# Patient Record
Sex: Male | Born: 1975 | Race: Black or African American | Hispanic: No | Marital: Married | Smoking: Never smoker
Health system: Southern US, Community
[De-identification: ages and names within clinical notes are randomized; demographics above are authoritative.]

## PROBLEM LIST (undated history)

## (undated) DIAGNOSIS — S82143A Displaced bicondylar fracture of unspecified tibia, initial encounter for closed fracture: Secondary | ICD-10-CM

## (undated) DIAGNOSIS — I10 Essential (primary) hypertension: Secondary | ICD-10-CM

## (undated) DIAGNOSIS — E119 Type 2 diabetes mellitus without complications: Secondary | ICD-10-CM

## (undated) DIAGNOSIS — E785 Hyperlipidemia, unspecified: Secondary | ICD-10-CM

## (undated) DIAGNOSIS — S7291XA Unspecified fracture of right femur, initial encounter for closed fracture: Secondary | ICD-10-CM

## (undated) HISTORY — DX: Essential (primary) hypertension: I10

## (undated) HISTORY — DX: Hyperlipidemia, unspecified: E78.5

## (undated) HISTORY — DX: Type 2 diabetes mellitus without complications: E11.9

---

## 2003-11-06 ENCOUNTER — Emergency Department (HOSPITAL_COMMUNITY): Admission: AD | Admit: 2003-11-06 | Discharge: 2003-11-06 | Payer: Self-pay | Admitting: Family Medicine

## 2010-01-28 ENCOUNTER — Emergency Department (HOSPITAL_COMMUNITY): Admission: EM | Admit: 2010-01-28 | Discharge: 2010-01-28 | Payer: Self-pay | Admitting: Family Medicine

## 2010-01-29 ENCOUNTER — Emergency Department (HOSPITAL_COMMUNITY): Admission: EM | Admit: 2010-01-29 | Discharge: 2010-01-29 | Payer: Self-pay | Admitting: Emergency Medicine

## 2010-12-23 LAB — CBC
HCT: 44.2 % (ref 39.0–52.0)
Platelets: 195 10*3/uL (ref 150–400)

## 2010-12-23 LAB — BASIC METABOLIC PANEL
BUN: 7 mg/dL (ref 6–23)
Chloride: 103 mEq/L (ref 96–112)
Creatinine, Ser: 0.94 mg/dL (ref 0.4–1.5)
GFR calc non Af Amer: >60 mL/min (ref >60)
Glucose, Bld: 132 mg/dL — ABNORMAL HIGH (ref 70–99)
Potassium: 4.2 mEq/L (ref 3.5–5.1)
Sodium: 137 mEq/L (ref 135–145)

## 2010-12-23 LAB — DIFFERENTIAL
Basophils Relative: 0 % (ref 0–1)
Eosinophils Relative: 0 % (ref 0–5)
Monocytes Absolute: 0.9 10*3/uL (ref 0.1–1.0)
Monocytes Relative: 8 % (ref 3–12)

## 2010-12-23 LAB — POCT INFECTIOUS MONO SCREEN: Mono Screen: POSITIVE — AB

## 2010-12-23 LAB — POCT RAPID STREP A (OFFICE): Streptococcus, Group A Screen (Direct): NEGATIVE

## 2014-10-23 ENCOUNTER — Emergency Department (HOSPITAL_COMMUNITY)
Admission: EM | Admit: 2014-10-23 | Discharge: 2014-10-23 | Disposition: A | Discharge status: Home / Self Care | Payer: BC Managed Care – PPO | Attending: Emergency Medicine | Admitting: Emergency Medicine

## 2014-10-23 ENCOUNTER — Encounter (HOSPITAL_COMMUNITY): Payer: Self-pay

## 2014-10-23 DIAGNOSIS — R002 Palpitations: Secondary | ICD-10-CM

## 2014-10-23 DIAGNOSIS — Z791 Long term (current) use of non-steroidal anti-inflammatories (NSAID): Secondary | ICD-10-CM | POA: Diagnosis not present

## 2014-10-23 LAB — I-STAT CHEM 8, ED
BUN: 10 mg/dL (ref 6–23)
CREATININE: 1 mg/dL (ref 0.50–1.35)
Calcium, Ion: 1.17 mmol/L (ref 1.12–1.23)
Chloride: 102 mEq/L (ref 96–112)
Glucose, Bld: 125 mg/dL — ABNORMAL HIGH (ref 70–99)
HEMATOCRIT: 49 % (ref 39.0–52.0)
Hemoglobin: 16.7 g/dL (ref 13.0–17.0)
Potassium: 3.8 mmol/L (ref 3.5–5.1)
SODIUM: 142 mmol/L (ref 135–145)
TCO2: 25 mmol/L (ref 0–100)

## 2014-10-23 LAB — RAPID URINE DRUG SCREEN, HOSP PERFORMED
AMPHETAMINES: NOT DETECTED
BARBITURATES: NOT DETECTED
BENZODIAZEPINES: NOT DETECTED
Cocaine: NOT DETECTED
OPIATES: NOT DETECTED
Tetrahydrocannabinol: NOT DETECTED

## 2014-10-23 LAB — URINALYSIS, ROUTINE W REFLEX MICROSCOPIC
BILIRUBIN URINE: NEGATIVE
Glucose, UA: NEGATIVE mg/dL
Hgb urine dipstick: NEGATIVE
KETONES UR: NEGATIVE mg/dL
LEUKOCYTES UA: NEGATIVE
NITRITE: NEGATIVE
PH: 7 (ref 5.0–8.0)
Protein, ur: NEGATIVE mg/dL
SPECIFIC GRAVITY, URINE: 1.021 (ref 1.005–1.030)
UROBILINOGEN UA: 0.2 mg/dL (ref 0.0–1.0)

## 2014-10-23 NOTE — ED Provider Notes (Signed)
CSN: [id number]     Arrival date & time 10/23/14  2159 History   First MD Initiated Contact with Patient 10/23/14 2211     Chief Complaint  Patient presents with  . Palpitations     (Consider location/radiation/quality/duration/timing/severity/associated sxs/prior Treatment) HPI   Hunter [name] is a 39 y.o. male who is here for evaluation of episodes of palpitation which are ongoing for 2 days.  He does not have any associated weakness, dizziness, nausea, vomiting, syncope, chest pain or shortness of breath.  He does not use caffeine regularly.  He does not take any medicines regularly other than Aleve, when necessary muscle aches.  Exercises doing both weight training, and cardio training, regularly.  He has not ever have symptoms when he is exercising.  There are no other known modifying factors.   History reviewed. No pertinent past medical history. History reviewed. No pertinent past surgical history. History reviewed. No pertinent family history. History  Substance Use Topics  . Smoking status: Never Smoker   . Smokeless tobacco: Not on file  . Alcohol Use: Yes    Review of Systems  All other systems reviewed and are negative.     Allergies  Review of patient's allergies indicates no known allergies.  Home Medications   Prior to Admission medications   Medication Sig Start Date End Date Taking? Authorizing Provider  naproxen sodium (ANAPROX) 220 MG tablet Take 220 mg by mouth 2 (two) times daily with a meal.   Yes Historical Provider, MD   BP 137/85 mmHg  Pulse 67  Temp(Src) 97.9 F (36.6 C) (Oral)  Resp 22  Ht  (1.956 m)  Wt 275 lb (124.739 kg)  BMI 32.60 kg/m2  SpO2 100% Physical Exam  Constitutional: He is oriented to person, place, and time. He appears well-developed and well-nourished. No distress.  HENT:  Head: Normocephalic and atraumatic.  Right Ear: External ear normal.  Left Ear: External ear normal.  Eyes: Conjunctivae and EOM are normal.  Pupils are equal, round, and reactive to light.  Neck: Normal range of motion and phonation normal. Neck supple.  Cardiovascular: Normal rate, regular rhythm and normal heart sounds.   Pulmonary/Chest: Effort normal and breath sounds normal. No respiratory distress. He has no wheezes. He exhibits no bony tenderness.  Abdominal: Soft. There is no tenderness.  Musculoskeletal: Normal range of motion.  Neurological: He is alert and oriented to person, place, and time. No cranial nerve deficit or sensory deficit. He exhibits normal muscle tone. Coordination normal.  Skin: Skin is warm, dry and intact.  Psychiatric: He has a normal mood and affect. His behavior is normal. Judgment and thought content normal.  Nursing note and vitals reviewed.   ED Course  Procedures (including critical care time)   Findings discussed with patient and wife, all questions answered.   Labs Review Labs Reviewed  I-STAT CHEM 8, ED - Abnormal; Notable for the following:    Glucose, Bld 125 (*)    All other components within normal limits  URINALYSIS, ROUTINE W REFLEX MICROSCOPIC  URINE RAPID DRUG SCREEN (HOSP PERFORMED)    Imaging Review No results found.   EKG Interpretation   Date/Time:  Tuesday October 23 2014 22:08:44 EST Ventricular Rate:  75 PR Interval:  170 QRS Duration: 78 QT Interval:  374 QTC Calculation: 417 R Axis:   -26 Text Interpretation:  Normal sinus rhythm Nonspecific T wave abnormality  Abnormal ECG No old tracing to compare Confirmed by Effie Shy  MD, Shundra Wirsing  (  [id number]) on 10/23/2014 10:18:17 PM      MDM   Final diagnoses:  Palpitations    Nonspecific palpitations, without findings for ACS, PE or pneumonia.  There's been no syncope.  I doubt serious patella, infection, metabolic instability or impending vascular collapse.  Nursing Notes Reviewed/ Care Coordinated Applicable Imaging Reviewed Interpretation of Laboratory Data incorporated into ED treatment  The patient  appears reasonably screened and/or stabilized for discharge and I doubt any other medical condition or other [facility] requiring further screening, evaluation, or treatment in the ED at this time prior to discharge.  Plan: Home Medications- none; Home Treatments- rest, fluids; return here if the recommended treatment, does not improve the symptoms; Recommended follow up- PCP prn     Flint MelterElliott L Yaniel Limbaugh, MD 10/23/14 2314

## 2014-10-23 NOTE — Discharge Instructions (Signed)
Get plenty of rest, drink a lot of fluids. If your symptoms progress, or associated with weakness, or fainting; follow-up with a doctor for checkup. Consider getting a primary care doctor, for ongoing management, and treatment as needed.   Palpitations A palpitation is the feeling that your heartbeat is irregular or is faster than normal. It may feel like your heart is fluttering or skipping a beat. Palpitations are usually not a serious problem. However, in some cases, you may need further medical evaluation. CAUSES  Palpitations can be caused by:  Smoking.  Caffeine or other stimulants, such as diet pills or energy drinks.  Alcohol.  Stress and anxiety.  Strenuous physical activity.  Fatigue.  Certain medicines.  Heart disease, especially if you have a history of irregular heart rhythms (arrhythmias), such as atrial fibrillation, atrial flutter, or supraventricular tachycardia.  An improperly working pacemaker or defibrillator. DIAGNOSIS  To find the cause of your palpitations, your health care provider will take your medical history and perform a physical exam. Your health care provider may also have you take a test called an ambulatory electrocardiogram (ECG). An ECG records your heartbeat patterns over a 24-hour period. You may also have other tests, such as:  Transthoracic echocardiogram (TTE). During echocardiography, sound waves are used to evaluate how blood flows through your heart.  Transesophageal echocardiogram (TEE).  Cardiac monitoring. This allows your health care provider to monitor your heart rate and rhythm in real time.  Holter monitor. This is a portable device that records your heartbeat and can help diagnose heart arrhythmias. It allows your health care provider to track your heart activity for several days, if needed.  Stress tests by exercise or by giving medicine that makes the heart beat faster. TREATMENT  Treatment of palpitations depends on the  cause of your symptoms and can vary greatly. Most cases of palpitations do not require any treatment other than time, relaxation, and monitoring your symptoms. Other causes, such as atrial fibrillation, atrial flutter, or supraventricular tachycardia, usually require further treatment. HOME CARE INSTRUCTIONS   Avoid:  Caffeinated coffee, tea, soft drinks, diet pills, and energy drinks.  Chocolate.  Alcohol.  Stop smoking if you smoke.  Reduce your stress and anxiety. Things that can help you relax include:  A method of controlling things in your body, such as your heartbeats, with your mind (biofeedback).  Yoga.  Meditation.  Physical activity such as swimming, jogging, or walking.  Get plenty of rest and sleep. SEEK MEDICAL CARE IF:   You continue to have a fast or irregular heartbeat beyond 24 hours.  Your palpitations occur more often. SEEK IMMEDIATE MEDICAL CARE IF:  You have chest pain or shortness of breath.  You have a severe headache.  You feel dizzy or you faint. MAKE SURE YOU:  Understand these instructions.  Will watch your condition.  Will get help right away if you are not doing well or get worse. Document Released: 09/18/2000 Document Revised: 09/26/2013 Document Reviewed: 11/20/2011 [facility] Patient Information 2015 [employer] [place]. This information is not intended to replace advice given to you by your health care provider. Make sure you discuss any questions you have with your health care provider.
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
## 2015-06-10 ENCOUNTER — Emergency Department (HOSPITAL_COMMUNITY): Payer: BC Managed Care – PPO

## 2015-06-10 ENCOUNTER — Encounter (HOSPITAL_COMMUNITY): Payer: Self-pay | Admitting: *Deleted

## 2015-06-10 ENCOUNTER — Inpatient Hospital Stay (HOSPITAL_COMMUNITY)
Admission: EM | Admit: 2015-06-10 | Discharge: 2015-06-17 | DRG: 481 | Disposition: A | Discharge status: Home Health | Payer: BC Managed Care – PPO | Attending: Orthopedic Surgery | Admitting: Orthopedic Surgery

## 2015-06-10 DIAGNOSIS — D62 Acute posthemorrhagic anemia: Secondary | ICD-10-CM | POA: Diagnosis not present

## 2015-06-10 DIAGNOSIS — T148XXA Other injury of unspecified body region, initial encounter: Secondary | ICD-10-CM

## 2015-06-10 DIAGNOSIS — S8001XA Contusion of right knee, initial encounter: Secondary | ICD-10-CM | POA: Diagnosis present

## 2015-06-10 DIAGNOSIS — S7221XA Displaced subtrochanteric fracture of right femur, initial encounter for closed fracture: Principal | ICD-10-CM | POA: Diagnosis present

## 2015-06-10 DIAGNOSIS — S82143A Displaced bicondylar fracture of unspecified tibia, initial encounter for closed fracture: Secondary | ICD-10-CM

## 2015-06-10 DIAGNOSIS — S7291XA Unspecified fracture of right femur, initial encounter for closed fracture: Secondary | ICD-10-CM

## 2015-06-10 DIAGNOSIS — F17210 Nicotine dependence, cigarettes, uncomplicated: Secondary | ICD-10-CM | POA: Diagnosis present

## 2015-06-10 DIAGNOSIS — S82144A Nondisplaced bicondylar fracture of right tibia, initial encounter for closed fracture: Secondary | ICD-10-CM | POA: Diagnosis present

## 2015-06-10 DIAGNOSIS — M25461 Effusion, right knee: Secondary | ICD-10-CM

## 2015-06-10 DIAGNOSIS — S7290XA Unspecified fracture of unspecified femur, initial encounter for closed fracture: Secondary | ICD-10-CM

## 2015-06-10 DIAGNOSIS — Z09 Encounter for follow-up examination after completed treatment for conditions other than malignant neoplasm: Secondary | ICD-10-CM

## 2015-06-10 HISTORY — DX: Unspecified fracture of right femur, initial encounter for closed fracture: S72.91XA

## 2015-06-10 HISTORY — DX: Displaced bicondylar fracture of unspecified tibia, initial encounter for closed fracture: S82.143A

## 2015-06-10 LAB — CBC WITH DIFFERENTIAL/PLATELET
Basophils Absolute: 0 10*3/uL (ref 0.0–0.1)
Basophils Relative: 0 % (ref 0–1)
EOS PCT: 1 % (ref 0–5)
Eosinophils Absolute: 0 10*3/uL (ref 0.0–0.7)
HEMATOCRIT: 47.4 % (ref 39.0–52.0)
Hemoglobin: 16.3 g/dL (ref 13.0–17.0)
LYMPHS ABS: 1.9 10*3/uL (ref 0.7–4.0)
LYMPHS PCT: 30 % (ref 12–46)
MCH: 28.3 pg (ref 26.0–34.0)
MCHC: 34.4 g/dL (ref 30.0–36.0)
MCV: 82.4 fL (ref 78.0–100.0)
MONO ABS: 0.3 10*3/uL (ref 0.1–1.0)
Monocytes Relative: 4 % (ref 3–12)
NEUTROS ABS: 4.1 10*3/uL (ref 1.7–7.7)
Neutrophils Relative %: 65 % (ref 43–77)
PLATELETS: 194 10*3/uL (ref 150–400)
RBC: 5.75 MIL/uL (ref 4.22–5.81)
RDW: 12.4 % (ref 11.5–15.5)
WBC: 6.4 10*3/uL (ref 4.0–10.5)

## 2015-06-10 LAB — URINALYSIS, ROUTINE W REFLEX MICROSCOPIC
BILIRUBIN URINE: NEGATIVE
GLUCOSE, UA: NEGATIVE mg/dL
KETONES UR: NEGATIVE mg/dL
LEUKOCYTES UA: NEGATIVE
Nitrite: NEGATIVE
PH: 5 (ref 5.0–8.0)
Protein, ur: 30 mg/dL — AB
SPECIFIC GRAVITY, URINE: 1.022 (ref 1.005–1.030)
Urobilinogen, UA: 0.2 mg/dL (ref 0.0–1.0)

## 2015-06-10 LAB — URINE MICROSCOPIC-ADD ON

## 2015-06-10 LAB — BASIC METABOLIC PANEL
Anion gap: 9 (ref 5–15)
BUN: 10 mg/dL (ref 6–20)
CHLORIDE: 105 mmol/L (ref 101–111)
CO2: 25 mmol/L (ref 22–32)
Calcium: 9.1 mg/dL (ref 8.9–10.3)
Creatinine, Ser: 1.28 mg/dL — ABNORMAL HIGH (ref 0.61–1.24)
GFR calc Af Amer: >60 mL/min (ref >60)
GFR calc non Af Amer: >60 mL/min (ref >60)
GLUCOSE: 133 mg/dL — AB (ref 65–99)
POTASSIUM: 3.4 mmol/L — AB (ref 3.5–5.1)
Sodium: 139 mmol/L (ref 135–145)

## 2015-06-10 LAB — TYPE AND SCREEN
ABO/RH(D): O POS
ANTIBODY SCREEN: NEGATIVE

## 2015-06-10 MED ORDER — HYDROMORPHONE HCL 1 MG/ML IJ SOLN
1.0000 mg | Freq: Once | INTRAMUSCULAR | Status: AC
Start: 2015-06-10 — End: 2015-06-10
  Administered 2015-06-10: 1 mg via INTRAVENOUS
  Filled 2015-06-10: qty 1

## 2015-06-10 MED ORDER — ACETAMINOPHEN 325 MG PO TABS: 650.0000 mg | ORAL_TABLET | Freq: Four times a day (QID) | ORAL | Status: DC | PRN

## 2015-06-10 MED ORDER — BISACODYL 10 MG RE SUPP: 10.0000 mg | Freq: Every day | RECTAL | Status: DC | PRN

## 2015-06-10 MED ORDER — DEXTROSE 5 % IV SOLN
3.0000 g | INTRAVENOUS | Status: DC
  Filled 2015-06-10 (×2): qty 3000

## 2015-06-10 MED ORDER — ZOLPIDEM TARTRATE 5 MG PO TABS: 5.0000 mg | ORAL_TABLET | Freq: Every evening | ORAL | Status: DC | PRN

## 2015-06-10 MED ORDER — ALUM & MAG HYDROXIDE-SIMETH 200-200-20 MG/5ML PO SUSP: 30.0000 mL | Freq: Four times a day (QID) | ORAL | Status: DC | PRN

## 2015-06-10 MED ORDER — HYDROMORPHONE HCL 1 MG/ML IJ SOLN
0.5000 mg | INTRAMUSCULAR | Status: DC | PRN
  Administered 2015-06-11 (×3): 1 mg via INTRAVENOUS
  Filled 2015-06-10 (×3): qty 1

## 2015-06-10 MED ORDER — HYDROMORPHONE HCL 1 MG/ML IJ SOLN
1.0000 mg | Freq: Once | INTRAMUSCULAR | Status: AC
  Administered 2015-06-10: 1 mg via INTRAVENOUS

## 2015-06-10 MED ORDER — ONDANSETRON HCL 4 MG/2ML IJ SOLN
4.0000 mg | Freq: Four times a day (QID) | INTRAMUSCULAR | Status: DC | PRN
  Administered 2015-06-10 – 2015-06-11 (×2): 4 mg via INTRAVENOUS
  Filled 2015-06-10 (×3): qty 2

## 2015-06-10 MED ORDER — DOCUSATE SODIUM 100 MG PO CAPS: 100.0000 mg | ORAL_CAPSULE | Freq: Two times a day (BID) | ORAL | Status: DC

## 2015-06-10 MED ORDER — ONDANSETRON HCL 4 MG/2ML IJ SOLN
4.0000 mg | Freq: Once | INTRAMUSCULAR | Status: AC
  Administered 2015-06-10: 4 mg via INTRAVENOUS

## 2015-06-10 MED ORDER — HYDROMORPHONE HCL 1 MG/ML IJ SOLN
INTRAMUSCULAR | Status: AC
  Filled 2015-06-10: qty 1

## 2015-06-10 MED ORDER — OXYCODONE HCL 5 MG PO TABS: 5.0000 mg | ORAL_TABLET | ORAL | Status: DC | PRN

## 2015-06-10 MED ORDER — LIDOCAINE HCL (PF) 1 % IJ SOLN
INTRAMUSCULAR | Status: AC
  Filled 2015-06-10: qty 30

## 2015-06-10 MED ORDER — FLEET ENEMA 7-19 GM/118ML RE ENEM: 1.0000 | ENEMA | Freq: Once | RECTAL | Status: DC | PRN

## 2015-06-10 MED ORDER — ONDANSETRON HCL 4 MG PO TABS: 4.0000 mg | ORAL_TABLET | Freq: Four times a day (QID) | ORAL | Status: DC | PRN

## 2015-06-10 MED ORDER — HYDROMORPHONE HCL 1 MG/ML IJ SOLN
1.0000 mg | INTRAMUSCULAR | Status: DC | PRN
  Administered 2015-06-10: 1 mg via INTRAVENOUS
  Filled 2015-06-10: qty 1

## 2015-06-10 MED ORDER — POLYETHYLENE GLYCOL 3350 17 G PO PACK: 17.0000 g | PACK | Freq: Every day | ORAL | Status: DC | PRN

## 2015-06-10 MED ORDER — ACETAMINOPHEN 650 MG RE SUPP: 650.0000 mg | Freq: Four times a day (QID) | RECTAL | Status: DC | PRN

## 2015-06-10 MED ORDER — ONDANSETRON HCL 4 MG/2ML IJ SOLN
INTRAMUSCULAR | Status: AC
  Filled 2015-06-10: qty 2

## 2015-06-10 MED ORDER — KCL IN DEXTROSE-NACL 20-5-0.45 MEQ/L-%-% IV SOLN
INTRAVENOUS | Status: DC
Start: 2015-06-10 — End: 2015-06-11
  Administered 2015-06-11: 07:00:00 via INTRAVENOUS
  Filled 2015-06-10 (×3): qty 1000
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
## 2015-06-10 NOTE — Progress Notes (Signed)
Orthopedic Tech Progress Note Patient Details:  Hunter [name] 1975-12-09 [id number]  Patient ID: [name], male   DOB: Feb 18, 1976, 39 y.o.   MRN: [id number]   [name] 06/10/2015, 7:12 PMLevel 1 trauma
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
## 2015-06-10 NOTE — ED Notes (Addendum)
Patients possessions given to wife. Possessions include duty belt with 2 ammo cartridges, handcuffs, cell phone and dodge car key

## 2015-06-10 NOTE — ED Notes (Signed)
Ems gave total of fentanyl PTA

## 2015-06-10 NOTE — ED Notes (Signed)
Patient transported to X-ray 

## 2015-06-10 NOTE — Progress Notes (Signed)
Orthopedic Tech Progress Note Patient Details:  Hunter [name] 1976-08-26 [id number] Applied 20 lb skeletal traction to RLE.  Pulses, sensation, motion intact before and after application.  Capillary refill less than 2 seconds before and after application.  After application, pt. stated "That feels much better." Musculoskeletal Traction Type of Traction: Skeletal (Balanced Suspension) Traction Location: RLE Traction Weight: 20 lbs    Lesle Chris 06/10/2015, 11:44 PM

## 2015-06-11 ENCOUNTER — Encounter (HOSPITAL_COMMUNITY): Payer: Self-pay | Admitting: Anesthesiology

## 2015-06-11 ENCOUNTER — Inpatient Hospital Stay (HOSPITAL_COMMUNITY): Payer: BC Managed Care – PPO | Admitting: Anesthesiology

## 2015-06-11 ENCOUNTER — Inpatient Hospital Stay (HOSPITAL_COMMUNITY): Payer: BC Managed Care – PPO

## 2015-06-11 ENCOUNTER — Encounter (HOSPITAL_COMMUNITY)
Admission: EM | Disposition: A | Discharge status: Home Health | Payer: Self-pay | Source: Home / Self Care | Attending: Orthopedic Surgery

## 2015-06-11 HISTORY — PX: FEMUR IM NAIL: SHX1597

## 2015-06-11 LAB — SURGICAL PCR SCREEN
MRSA, PCR: NEGATIVE
Staphylococcus aureus: NEGATIVE

## 2015-06-11 LAB — ABO/RH: ABO/RH(D): O POS

## 2015-06-11 SURGERY — INSERTION, INTRAMEDULLARY ROD, FEMUR
Anesthesia: General | Site: Leg Upper | Laterality: Right

## 2015-06-11 SURGERY — INSERTION, INTRAMEDULLARY ROD, FEMUR
Anesthesia: General | Laterality: Right

## 2015-06-11 MED ORDER — ROCURONIUM BROMIDE 50 MG/5ML IV SOLN
INTRAVENOUS | Status: AC
  Filled 2015-06-11: qty 2

## 2015-06-11 MED ORDER — 0.9 % SODIUM CHLORIDE (POUR BTL) OPTIME
TOPICAL | Status: DC | PRN
  Administered 2015-06-11 (×3): 1000 mL

## 2015-06-11 MED ORDER — METOCLOPRAMIDE HCL 5 MG/ML IJ SOLN: 5.0000 mg | Freq: Three times a day (TID) | INTRAMUSCULAR | Status: DC | PRN

## 2015-06-11 MED ORDER — HYDROMORPHONE HCL 1 MG/ML IJ SOLN
0.2500 mg | INTRAMUSCULAR | Status: DC | PRN
  Administered 2015-06-11 (×2): 0.5 mg via INTRAVENOUS

## 2015-06-11 MED ORDER — ACETAMINOPHEN 325 MG PO TABS
650.0000 mg | ORAL_TABLET | Freq: Four times a day (QID) | ORAL | Status: DC | PRN
  Administered 2015-06-16 – 2015-06-17 (×2): 650 mg via ORAL
  Filled 2015-06-11: qty 2

## 2015-06-11 MED ORDER — METHOCARBAMOL 1000 MG/10ML IJ SOLN
1000.0000 mg | Freq: Four times a day (QID) | INTRAVENOUS | Status: DC | PRN
  Filled 2015-06-11: qty 10

## 2015-06-11 MED ORDER — HYDROMORPHONE HCL 1 MG/ML IJ SOLN
INTRAMUSCULAR | Status: AC
  Filled 2015-06-11: qty 1

## 2015-06-11 MED ORDER — POLYETHYLENE GLYCOL 3350 17 G PO PACK
17.0000 g | PACK | Freq: Every day | ORAL | Status: DC
  Administered 2015-06-12 – 2015-06-16 (×4): 17 g via ORAL
  Filled 2015-06-11 (×3): qty 1

## 2015-06-11 MED ORDER — NEOSTIGMINE METHYLSULFATE 10 MG/10ML IV SOLN
INTRAVENOUS | Status: DC | PRN
  Administered 2015-06-11: 3 mg via INTRAVENOUS

## 2015-06-11 MED ORDER — DIPHENHYDRAMINE HCL 50 MG/ML IJ SOLN: 12.5000 mg | Freq: Four times a day (QID) | INTRAMUSCULAR | Status: DC | PRN

## 2015-06-11 MED ORDER — FENTANYL CITRATE (PF) 100 MCG/2ML IJ SOLN
INTRAMUSCULAR | Status: DC | PRN
  Administered 2015-06-11 (×4): 50 ug via INTRAVENOUS
  Administered 2015-06-11: 200 ug via INTRAVENOUS
  Administered 2015-06-11 (×2): 50 ug via INTRAVENOUS

## 2015-06-11 MED ORDER — ONDANSETRON HCL 4 MG/2ML IJ SOLN
4.0000 mg | Freq: Four times a day (QID) | INTRAMUSCULAR | Status: DC | PRN
Start: 2015-06-11 — End: 2015-06-11

## 2015-06-11 MED ORDER — DIPHENHYDRAMINE HCL 12.5 MG/5ML PO ELIX
12.5000 mg | ORAL_SOLUTION | Freq: Four times a day (QID) | ORAL | Status: DC | PRN
  Administered 2015-06-12 – 2015-06-14 (×3): 12.5 mg via ORAL
  Filled 2015-06-11 (×3): qty 10

## 2015-06-11 MED ORDER — MIDAZOLAM HCL 2 MG/2ML IJ SOLN: 0.5000 mg | Freq: Once | INTRAMUSCULAR | Status: DC | PRN

## 2015-06-11 MED ORDER — OXYCODONE-ACETAMINOPHEN 5-325 MG PO TABS
1.0000 | ORAL_TABLET | Freq: Four times a day (QID) | ORAL | Status: DC | PRN
  Administered 2015-06-12: 1 via ORAL
  Administered 2015-06-13 – 2015-06-16 (×5): 2 via ORAL
  Administered 2015-06-16: 1 via ORAL
  Administered 2015-06-16 (×2): 2 via ORAL
  Filled 2015-06-11 (×2): qty 2
  Filled 2015-06-11: qty 1
  Filled 2015-06-11 (×2): qty 2
  Filled 2015-06-11: qty 1
  Filled 2015-06-11 (×4): qty 2

## 2015-06-11 MED ORDER — LACTATED RINGERS IV SOLN
INTRAVENOUS | Status: DC | PRN
  Administered 2015-06-11 (×2): via INTRAVENOUS

## 2015-06-11 MED ORDER — BISACODYL 5 MG PO TBEC
5.0000 mg | DELAYED_RELEASE_TABLET | Freq: Every day | ORAL | Status: DC | PRN
  Administered 2015-06-16: 5 mg via ORAL
  Filled 2015-06-11: qty 1

## 2015-06-11 MED ORDER — HYDROMORPHONE 0.3 MG/ML IV SOLN
INTRAVENOUS | Status: AC
  Filled 2015-06-11: qty 25

## 2015-06-11 MED ORDER — PROMETHAZINE HCL 25 MG/ML IJ SOLN: 6.2500 mg | INTRAMUSCULAR | Status: DC | PRN

## 2015-06-11 MED ORDER — DEXTROSE 5 % IV SOLN
3.0000 g | INTRAVENOUS | Status: DC | PRN
  Administered 2015-06-11: 3 g via INTRAVENOUS

## 2015-06-11 MED ORDER — MAGNESIUM CITRATE PO SOLN
1.0000 | Freq: Once | ORAL | Status: AC | PRN
  Administered 2015-06-16: 1 via ORAL
  Filled 2015-06-11: qty 296

## 2015-06-11 MED ORDER — INFLUENZA VAC SPLIT QUAD 0.5 ML IM SUSY
0.5000 mL | PREFILLED_SYRINGE | INTRAMUSCULAR | Status: AC
Start: 2015-06-12 — End: 2015-06-12
  Administered 2015-06-12: 0.5 mL via INTRAMUSCULAR
  Filled 2015-06-11: qty 0.5

## 2015-06-11 MED ORDER — LIDOCAINE HCL (CARDIAC) 20 MG/ML IV SOLN
INTRAVENOUS | Status: DC | PRN
  Administered 2015-06-11: 20 mg via INTRAVENOUS

## 2015-06-11 MED ORDER — HYDROXYZINE HCL 25 MG PO TABS: 50.0000 mg | ORAL_TABLET | Freq: Three times a day (TID) | ORAL | Status: DC | PRN

## 2015-06-11 MED ORDER — FENTANYL CITRATE (PF) 250 MCG/5ML IJ SOLN
INTRAMUSCULAR | Status: AC
Start: 2015-06-11 — End: 2015-06-11
  Filled 2015-06-11: qty 5

## 2015-06-11 MED ORDER — ONDANSETRON HCL 4 MG/2ML IJ SOLN
4.0000 mg | Freq: Four times a day (QID) | INTRAMUSCULAR | Status: DC | PRN
  Administered 2015-06-11 – 2015-06-15 (×3): 4 mg via INTRAVENOUS
  Filled 2015-06-11 (×3): qty 2

## 2015-06-11 MED ORDER — GLYCOPYRROLATE 0.2 MG/ML IJ SOLN
INTRAMUSCULAR | Status: DC | PRN
  Administered 2015-06-11: 0.4 mg via INTRAVENOUS

## 2015-06-11 MED ORDER — METOCLOPRAMIDE HCL 5 MG PO TABS
5.0000 mg | ORAL_TABLET | Freq: Three times a day (TID) | ORAL | Status: DC | PRN
  Administered 2015-06-16: 10 mg via ORAL
  Filled 2015-06-11: qty 2

## 2015-06-11 MED ORDER — MIDAZOLAM HCL 5 MG/5ML IJ SOLN
INTRAMUSCULAR | Status: DC | PRN
  Administered 2015-06-11: 2 mg via INTRAVENOUS

## 2015-06-11 MED ORDER — ROCURONIUM BROMIDE 50 MG/5ML IV SOLN
INTRAVENOUS | Status: AC
  Filled 2015-06-11: qty 1

## 2015-06-11 MED ORDER — HYDROMORPHONE HCL 1 MG/ML IJ SOLN
0.5000 mg | INTRAMUSCULAR | Status: DC | PRN
  Administered 2015-06-12 (×3): 1 mg via INTRAVENOUS
  Filled 2015-06-11 (×3): qty 1

## 2015-06-11 MED ORDER — LACTATED RINGERS IV SOLN: INTRAVENOUS | Status: DC

## 2015-06-11 MED ORDER — ENOXAPARIN SODIUM 40 MG/0.4ML ~~LOC~~ SOLN
40.0000 mg | SUBCUTANEOUS | Status: DC
  Administered 2015-06-12 – 2015-06-16 (×4): 40 mg via SUBCUTANEOUS
  Filled 2015-06-11 (×5): qty 0.4

## 2015-06-11 MED ORDER — DEXAMETHASONE SODIUM PHOSPHATE 4 MG/ML IJ SOLN
INTRAMUSCULAR | Status: DC | PRN
  Administered 2015-06-11: 10 mg via INTRAVENOUS

## 2015-06-11 MED ORDER — POTASSIUM CHLORIDE IN NACL 20-0.9 MEQ/L-% IV SOLN
INTRAVENOUS | Status: DC
  Administered 2015-06-11: 22:00:00 via INTRAVENOUS
  Filled 2015-06-11: qty 1000

## 2015-06-11 MED ORDER — ROCURONIUM BROMIDE 100 MG/10ML IV SOLN
INTRAVENOUS | Status: DC | PRN
  Administered 2015-06-11 (×2): 50 mg via INTRAVENOUS

## 2015-06-11 MED ORDER — PROPOFOL 10 MG/ML IV BOLUS
INTRAVENOUS | Status: DC | PRN
  Administered 2015-06-11: 200 mg via INTRAVENOUS

## 2015-06-11 MED ORDER — ACETAMINOPHEN 650 MG RE SUPP: 650.0000 mg | Freq: Four times a day (QID) | RECTAL | Status: DC | PRN

## 2015-06-11 MED ORDER — MIDAZOLAM HCL 2 MG/2ML IJ SOLN
INTRAMUSCULAR | Status: AC
  Filled 2015-06-11: qty 4

## 2015-06-11 MED ORDER — METHOCARBAMOL 500 MG PO TABS
500.0000 mg | ORAL_TABLET | Freq: Four times a day (QID) | ORAL | Status: DC | PRN
  Administered 2015-06-11 – 2015-06-15 (×8): 500 mg via ORAL
  Administered 2015-06-15: 1000 mg via ORAL
  Administered 2015-06-16: 500 mg via ORAL
  Administered 2015-06-16 (×2): 1000 mg via ORAL
  Filled 2015-06-11 (×6): qty 1
  Filled 2015-06-11: qty 2
  Filled 2015-06-11: qty 1
  Filled 2015-06-11: qty 2
  Filled 2015-06-11 (×4): qty 1
  Filled 2015-06-11: qty 2
  Filled 2015-06-11: qty 1

## 2015-06-11 MED ORDER — NALOXONE HCL 0.4 MG/ML IJ SOLN: 0.4000 mg | INTRAMUSCULAR | Status: DC | PRN

## 2015-06-11 MED ORDER — FENTANYL CITRATE (PF) 250 MCG/5ML IJ SOLN
INTRAMUSCULAR | Status: AC
  Filled 2015-06-11: qty 5

## 2015-06-11 MED ORDER — SODIUM CHLORIDE 0.9 % IJ SOLN: 9.0000 mL | INTRAMUSCULAR | Status: DC | PRN

## 2015-06-11 MED ORDER — MEPERIDINE HCL 25 MG/ML IJ SOLN: 6.2500 mg | INTRAMUSCULAR | Status: DC | PRN

## 2015-06-11 MED ORDER — ONDANSETRON HCL 4 MG PO TABS
4.0000 mg | ORAL_TABLET | Freq: Four times a day (QID) | ORAL | Status: DC | PRN
  Administered 2015-06-16 (×2): 4 mg via ORAL
  Filled 2015-06-11: qty 1

## 2015-06-11 MED ORDER — CEFAZOLIN SODIUM-DEXTROSE 2-3 GM-% IV SOLR
2.0000 g | Freq: Four times a day (QID) | INTRAVENOUS | Status: AC
  Administered 2015-06-11 – 2015-06-12 (×3): 2 g via INTRAVENOUS
  Filled 2015-06-11 (×3): qty 50

## 2015-06-11 MED ORDER — ONDANSETRON HCL 4 MG/2ML IJ SOLN
INTRAMUSCULAR | Status: DC | PRN
  Administered 2015-06-11 (×2): 4 mg via INTRAVENOUS

## 2015-06-11 MED ORDER — DOCUSATE SODIUM 100 MG PO CAPS
100.0000 mg | ORAL_CAPSULE | Freq: Two times a day (BID) | ORAL | Status: DC
  Administered 2015-06-11 – 2015-06-16 (×9): 100 mg via ORAL
  Filled 2015-06-11 (×8): qty 1

## 2015-06-11 MED ORDER — OXYCODONE HCL 5 MG PO TABS
5.0000 mg | ORAL_TABLET | ORAL | Status: DC | PRN
  Administered 2015-06-13 – 2015-06-14 (×9): 10 mg via ORAL
  Administered 2015-06-15: 5 mg via ORAL
  Administered 2015-06-15: 10 mg via ORAL
  Administered 2015-06-15: 5 mg via ORAL
  Administered 2015-06-15 – 2015-06-16 (×2): 10 mg via ORAL
  Administered 2015-06-16 (×3): 5 mg via ORAL
  Administered 2015-06-17: 10 mg via ORAL
  Filled 2015-06-11 (×3): qty 2
  Filled 2015-06-11: qty 1
  Filled 2015-06-11: qty 2
  Filled 2015-06-11 (×2): qty 1
  Filled 2015-06-11 (×2): qty 2
  Filled 2015-06-11: qty 1
  Filled 2015-06-11 (×3): qty 2
  Filled 2015-06-11: qty 1
  Filled 2015-06-11 (×4): qty 2

## 2015-06-11 MED ORDER — HYDROMORPHONE 0.3 MG/ML IV SOLN
INTRAVENOUS | Status: DC
  Administered 2015-06-11: 17:00:00 via INTRAVENOUS
  Administered 2015-06-11: 0.6 mg via INTRAVENOUS
  Administered 2015-06-12: 0.9 mg via INTRAVENOUS
  Administered 2015-06-12: 0.6 mg via INTRAVENOUS
  Administered 2015-06-12: 0.9 mg via INTRAVENOUS

## 2015-06-11 SURGICAL SUPPLY — 60 items
BIT DRILL AO GAMMA 4.2X180 (BIT) ×3 IMPLANT
BNDG COHESIVE 6X5 TAN STRL LF (GAUZE/BANDAGES/DRESSINGS) ×3 IMPLANT
BRUSH SCRUB DISP (MISCELLANEOUS) ×6 IMPLANT
COVER PERINEAL POST (MISCELLANEOUS) IMPLANT
COVER SURGICAL LIGHT HANDLE (MISCELLANEOUS) ×3 IMPLANT
DRAPE C-ARMOR (DRAPES) ×3 IMPLANT
DRAPE ORTHO SPLIT 77X108 STRL (DRAPES) ×4
DRAPE PROXIMA HALF (DRAPES) ×6 IMPLANT
DRAPE STERI IOBAN 125X83 (DRAPES) IMPLANT
DRAPE SURG ORHT 6 SPLT 77X108 (DRAPES) ×2 IMPLANT
DRAPE U-SHAPE 47X51 STRL (DRAPES) ×3 IMPLANT
DRSG EMULSION OIL 3X3 NADH (GAUZE/BANDAGES/DRESSINGS) IMPLANT
DRSG MEPILEX BORDER 4X4 (GAUZE/BANDAGES/DRESSINGS) ×6 IMPLANT
DRSG MEPILEX BORDER 4X8 (GAUZE/BANDAGES/DRESSINGS) ×3 IMPLANT
ELECT REM PT RETURN 9FT ADLT (ELECTROSURGICAL) ×3
ELECTRODE REM PT RTRN 9FT ADLT (ELECTROSURGICAL) ×1 IMPLANT
GLOVE BIO SURGEON STRL SZ7.5 (GLOVE) ×3 IMPLANT
GLOVE BIO SURGEON STRL SZ8 (GLOVE) ×3 IMPLANT
GLOVE BIOGEL PI IND STRL 7.5 (GLOVE) ×1 IMPLANT
GLOVE BIOGEL PI IND STRL 8 (GLOVE) ×1 IMPLANT
GLOVE BIOGEL PI INDICATOR 7.5 (GLOVE) ×2
GLOVE BIOGEL PI INDICATOR 8 (GLOVE) ×2
GOWN STRL REUS W/ TWL LRG LVL3 (GOWN DISPOSABLE) ×2 IMPLANT
GOWN STRL REUS W/ TWL XL LVL3 (GOWN DISPOSABLE) ×1 IMPLANT
GOWN STRL REUS W/TWL LRG LVL3 (GOWN DISPOSABLE) ×4
GOWN STRL REUS W/TWL XL LVL3 (GOWN DISPOSABLE) ×2
GUIDEROD T2 3X1000 (ROD) ×3 IMPLANT
K-WIRE  3.2X450M STR (WIRE) ×2
K-WIRE 3.2X450M STR (WIRE) ×1
K-WIRE RECON 3.2X400 (WIRE) ×6
KIT BASIN OR (CUSTOM PROCEDURE TRAY) ×3 IMPLANT
KIT ROOM TURNOVER OR (KITS) ×3 IMPLANT
KWIRE 3.2X450M STR (WIRE) ×1 IMPLANT
KWIRE RECON 3.2X400 (WIRE) ×2 IMPLANT
LINER BOOT UNIVERSAL DISP (MISCELLANEOUS) IMPLANT
MANIFOLD NEPTUNE II (INSTRUMENTS) IMPLANT
NAIL RECON TI RT 11X440MM-125 (Nail) ×3 IMPLANT
NS IRRIG 1000ML POUR BTL (IV SOLUTION) ×3 IMPLANT
PACK GENERAL/GYN (CUSTOM PROCEDURE TRAY) ×3 IMPLANT
PAD ARMBOARD 7.5X6 YLW CONV (MISCELLANEOUS) ×6 IMPLANT
REAMER SHAFT BIXCUT (INSTRUMENTS) ×3 IMPLANT
SCREW GAMMA (Screw) ×3 IMPLANT
SCREW LAG RECON 6.5X80MM (Screw) ×3 IMPLANT
SCREW LAG RECON 6.5X90MM (Screw) ×3 IMPLANT
SCREW LAG RECON 6.5X95MM (Screw) ×3 IMPLANT
SCREW LOCKING T2 F/T  5MMX55MM (Screw) ×2 IMPLANT
SCREW LOCKING T2 F/T 5MMX55MM (Screw) ×1 IMPLANT
STAPLER VISISTAT 35W (STAPLE) ×3 IMPLANT
STEPDRILL FOR LAG SCREW RECON (DRILL) ×3 IMPLANT
STOCKINETTE IMPERVIOUS LG (DRAPES) ×3 IMPLANT
SUT ETHILON 3 0 FSL (SUTURE) ×3 IMPLANT
SUT ETHILON 3 0 PS 1 (SUTURE) ×6 IMPLANT
SUT VIC AB 0 CT1 27 (SUTURE) ×2
SUT VIC AB 0 CT1 27XBRD ANBCTR (SUTURE) ×1 IMPLANT
SUT VIC AB 1 CT1 27 (SUTURE)
SUT VIC AB 1 CT1 27XBRD ANBCTR (SUTURE) IMPLANT
SUT VIC AB 2-0 CT1 27 (SUTURE) ×2
SUT VIC AB 2-0 CT1 TAPERPNT 27 (SUTURE) ×1 IMPLANT
TOWEL OR 17X24 6PK STRL BLUE (TOWEL DISPOSABLE) ×3 IMPLANT
TOWEL OR 17X26 10 PK STRL BLUE (TOWEL DISPOSABLE) ×6 IMPLANT

## 2015-06-11 NOTE — Brief Op Note (Signed)
06/10/2015 - 06/11/2015  5:52 PM  PATIENT:  Hunter [name]  39 y.o. male  PRE-OPERATIVE DIAGNOSIS:  1. RIGHT PROXIMAL THIRD FEMORAL SHAFT FRACTURE 2. RIGHT TIBIAL TRACTION PIN 3. RIGHT KNEE EFFUSION  POST-OPERATIVE DIAGNOSIS:   1. RIGHT PROXIMAL THIRD FEMORAL SHAFT FRACTURE 2. RIGHT TIBIAL TRACTION PIN 3. RIGHT KNEE HEMARTHROSIS  PROCEDURE:  Procedure(s): 1. INTRAMEDULLARY (IM) NAIL FEMORAL (Right), STRYKER T2 RECON, STATICALLY LOCKED 2. REMOVAL OF TRACTION PIN 3. ASPIRATION OF KNEE JOINT  SURGEON:  Surgeon(s) and Role:    * Myrene Galas, MD - Primary  PHYSICIAN ASSISTANT: Montez Morita, PA-C  ANESTHESIA:   general  I/O:  Total I/O In: 1500 [I.V.:1500] Out: 660 [Urine:600; Blood:60]  SPECIMEN:  No Specimen  TOURNIQUET:  * No tourniquets in log *  DICTATION: .Other Dictation: Dictation Number [id number]

## 2015-06-11 NOTE — Progress Notes (Signed)
Utilization review completed.  
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
## 2015-06-11 NOTE — Transfer of Care (Signed)
Immediate Anesthesia Transfer of Care Note  Patient: Hunter [name]  Procedure(s) Performed: Procedure(s): INTRAMEDULLARY (IM) NAIL FEMORAL (Right)  Patient Location: PACU  Anesthesia Type:General  Level of Consciousness: awake, alert , oriented and patient cooperative  Airway & Oxygen Therapy: Patient Spontanous Breathing and Patient connected to nasal cannula oxygen  Post-op Assessment: Report given to RN and Post -op Vital signs reviewed and stable  Post vital signs: Reviewed and stable  Last Vitals:  Filed Vitals:   06/11/15 1710  BP:   Pulse:   Temp: 36.6 C  Resp: 18    Complications: No apparent anesthesia complications
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
## 2015-06-11 NOTE — Anesthesia Postprocedure Evaluation (Signed)
  Anesthesia Post-op Note  Patient: Hunter [name]  Procedure(s) Performed: Procedure(s): INTRAMEDULLARY (IM) NAIL FEMORAL (Right)  Patient Location: PACU  Anesthesia Type:General  Level of Consciousness: awake, alert , oriented and patient cooperative  Airway and Oxygen Therapy: Patient Spontanous Breathing and Patient connected to nasal cannula oxygen  Post-op Pain: mild  Post-op Assessment: Post-op Vital signs reviewed, Patient's Cardiovascular Status Stable, Respiratory Function Stable, Patent Airway, No signs of Nausea or vomiting and Pain level controlled     RLE Motor Response: Purposeful movement RLE Sensation: Full sensation      Post-op Vital Signs: Reviewed and stable  Last Vitals:  Filed Vitals:   06/11/15 1859  BP: 140/84  Pulse: 90  Temp: 36.9 C  Resp: 19    Complications: No apparent anesthesia complications

## 2015-06-11 NOTE — Progress Notes (Signed)
Spoke with patient and verified that injury is Worker's Comp. He gave me the phone # for officer Madelyn Brunner who handles Workers Comp for patient's division, [phone]. Contacted Officer McKeithan, a claim has not been filed yet but will be thru [employer] in [place] [phone], fax # [phone]. Adjustor is usually Clement Husbands [phone]. Will contact Mr Yetta Barre once d/c needs are determined. Will continue to follow.

## 2015-06-12 ENCOUNTER — Encounter (HOSPITAL_COMMUNITY): Payer: Self-pay | Admitting: Orthopedic Surgery

## 2015-06-12 ENCOUNTER — Inpatient Hospital Stay (HOSPITAL_COMMUNITY): Payer: BC Managed Care – PPO

## 2015-06-12 LAB — BASIC METABOLIC PANEL
ANION GAP: 10 (ref 5–15)
BUN: 12 mg/dL (ref 6–20)
CALCIUM: 8.5 mg/dL — AB (ref 8.9–10.3)
CO2: 26 mmol/L (ref 22–32)
Chloride: 101 mmol/L (ref 101–111)
Creatinine, Ser: 1.07 mg/dL (ref 0.61–1.24)
Glucose, Bld: 141 mg/dL — ABNORMAL HIGH (ref 65–99)
POTASSIUM: 4 mmol/L (ref 3.5–5.1)
SODIUM: 137 mmol/L (ref 135–145)

## 2015-06-12 LAB — CBC
HCT: 40.3 % (ref 39.0–52.0)
HCT: 40.8 % (ref 39.0–52.0)
Hemoglobin: 13.6 g/dL (ref 13.0–17.0)
Hemoglobin: 13.5 g/dL (ref 13.0–17.0)
MCH: 28.2 pg (ref 26.0–34.0)
MCH: 27.7 pg (ref 26.0–34.0)
MCHC: 33.7 g/dL (ref 30.0–36.0)
MCHC: 33.1 g/dL (ref 30.0–36.0)
MCV: 83.6 fL (ref 78.0–100.0)
MCV: 83.8 fL (ref 78.0–100.0)
PLATELETS: 188 10*3/uL (ref 150–400)
PLATELETS: 150 10*3/uL (ref 150–400)
RBC: 4.82 MIL/uL (ref 4.22–5.81)
RBC: 4.87 MIL/uL (ref 4.22–5.81)
RDW: 12.8 % (ref 11.5–15.5)
RDW: 12.6 % (ref 11.5–15.5)
WBC: 9.4 10*3/uL (ref 4.0–10.5)
WBC: 10.1 10*3/uL (ref 4.0–10.5)

## 2015-06-12 MED ORDER — SODIUM CHLORIDE 0.9 % IV BOLUS (SEPSIS)
500.0000 mL | Freq: Once | INTRAVENOUS | Status: AC
  Administered 2015-06-12: 500 mL via INTRAVENOUS
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
## 2015-06-12 NOTE — Progress Notes (Signed)
Contact Workers Comp adjustor Clement Husbands, he requested that orders for HHPT and DME be faxed to him at [phone] and to [employer] at [phone]. Claim # is [id number]. Faxed orders as requested and received confirmation.Will continue to follow.

## 2015-06-13 ENCOUNTER — Encounter (HOSPITAL_COMMUNITY): Payer: Self-pay | Admitting: Orthopedic Surgery

## 2015-06-13 LAB — BASIC METABOLIC PANEL
Anion gap: 9 (ref 5–15)
BUN: 12 mg/dL (ref 6–20)
CHLORIDE: 102 mmol/L (ref 101–111)
CO2: 26 mmol/L (ref 22–32)
Calcium: 8.3 mg/dL — ABNORMAL LOW (ref 8.9–10.3)
Creatinine, Ser: 0.99 mg/dL (ref 0.61–1.24)
GFR calc Af Amer: >60 mL/min (ref >60)
GFR calc non Af Amer: >60 mL/min (ref >60)
Glucose, Bld: 117 mg/dL — ABNORMAL HIGH (ref 65–99)
POTASSIUM: 3.6 mmol/L (ref 3.5–5.1)
SODIUM: 137 mmol/L (ref 135–145)

## 2015-06-13 LAB — CBC
HCT: 38.3 % — ABNORMAL LOW (ref 39.0–52.0)
HEMOGLOBIN: 12.7 g/dL — AB (ref 13.0–17.0)
MCH: 27.7 pg (ref 26.0–34.0)
MCHC: 33.2 g/dL (ref 30.0–36.0)
MCV: 83.4 fL (ref 78.0–100.0)
Platelets: 132 10*3/uL — ABNORMAL LOW (ref 150–400)
RBC: 4.59 MIL/uL (ref 4.22–5.81)
RDW: 12.5 % (ref 11.5–15.5)
WBC: 6.8 10*3/uL (ref 4.0–10.5)

## 2015-06-13 MED ORDER — DOCUSATE SODIUM 100 MG PO CAPS: 100.0000 mg | ORAL_CAPSULE | Freq: Two times a day (BID) | ORAL | Status: DC

## 2015-06-13 MED ORDER — POLYETHYLENE GLYCOL 3350 17 G PO PACK: 17.0000 g | PACK | Freq: Every day | ORAL | Status: DC

## 2015-06-13 MED ORDER — OXYCODONE-ACETAMINOPHEN 5-325 MG PO TABS: 1.0000 | ORAL_TABLET | Freq: Four times a day (QID) | ORAL | Status: DC | PRN

## 2015-06-13 MED ORDER — METHOCARBAMOL 500 MG PO TABS: 500.0000 mg | ORAL_TABLET | Freq: Four times a day (QID) | ORAL | Status: DC | PRN

## 2015-06-13 MED ORDER — ENOXAPARIN SODIUM 40 MG/0.4ML ~~LOC~~ SOLN: 40.0000 mg | SUBCUTANEOUS | Status: DC

## 2015-06-13 MED ORDER — BISACODYL 5 MG PO TBEC: 5.0000 mg | DELAYED_RELEASE_TABLET | Freq: Every day | ORAL | Status: DC | PRN

## 2015-06-13 MED ORDER — OXYCODONE HCL 5 MG PO TABS: 5.0000 mg | ORAL_TABLET | Freq: Four times a day (QID) | ORAL | Status: DC | PRN
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
## 2015-06-13 NOTE — Progress Notes (Addendum)
Contacted [employer] to check progress of request for [facility] and DME, spoke with Harriett Sine who informed me that the fax I sent 06/12/15 was received but only the HHPT was entered into their system and it had not been worked on yet. Advised to refax orders for DME and put ASAP on the fax. Refaxed orders for HHPT, rolling walker, 3N1 and tub bench. Will continue to follow.     15:30 Contacted [employer] again and spoke with Marylu Lund, fax was received and she will try to expedite having the rolling walker delivered to the patient's room for discharge on 06/14/15. I requested that they call me with name and phone # of [facility] agency and DME company once assigned. Will continue to follow.
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
## 2015-06-13 NOTE — Discharge Instructions (Addendum)
[facility] Discharge Instructions   General Discharge Instructions  WEIGHT BEARING STATUS: NonWeightbearing Right leg  RANGE OF MOTION/ACTIVITY: Range of motion as tolerated Right Hip, knee and ankle  Home Exercise Program for R knee ROM- AROM, PROM. Prone exercises as well. No ROM restrictions.  Quad sets, SLR, LAQ, SAQ, heel slides, stretching, prone flexion and extension   PAIN MEDICATION USE AND EXPECTATIONS  You have likely been given narcotic medications to help control your pain.  After a traumatic event that results in an fracture (broken bone) with or without surgery, it is ok to use narcotic pain medications to help control one's pain.  We understand that everyone responds to pain differently and each individual patient will be evaluated on a regular basis for the continued need for narcotic medications. Ideally, narcotic medication use should last no more than 6-8 weeks (coinciding with fracture healing).   As a patient it is your responsibility as well to monitor narcotic medication use and report the amount and frequency you use these medications when you come to your office visit.   We would also advise that if you are using narcotic medications, you should take a dose prior to therapy to maximize you participation.  IF YOU ARE ON NARCOTIC MEDICATIONS IT IS NOT PERMISSIBLE TO OPERATE A MOTOR VEHICLE (MOTORCYCLE/CAR/TRUCK/MOPED) OR HEAVY MACHINERY DO NOT MIX NARCOTICS WITH OTHER CNS (CENTRAL NERVOUS SYSTEM) DEPRESSANTS SUCH AS ALCOHOL  Diet: as you were eating previously.  Can use over the counter stool softeners and bowel preparations, such as Miralax, to help with bowel movements.  Narcotics can be constipating.  Be sure to drink plenty of fluids  Wound Care: daily dressing changes starting 06/19/2015. See below for detailed instructions   STOP SMOKING OR USING NICOTINE PRODUCTS!!!!  As discussed nicotine severely impairs your body's ability to heal surgical and  traumatic wounds but also impairs bone healing.  Wounds and bone heal by forming microscopic blood vessels (angiogenesis) and nicotine is a vasoconstrictor (essentially, shrinks blood vessels).  Therefore, if vasoconstriction occurs to these microscopic blood vessels they essentially disappear and are unable to deliver necessary nutrients to the healing tissue.  This is one modifiable factor that you can do to dramatically increase your chances of healing your injury.    (This means no smoking, no nicotine gum, patches, etc)  DO NOT USE NONSTEROIDAL ANTI-INFLAMMATORY DRUGS (NSAID'S)  Using products such as Advil (ibuprofen), Aleve (naproxen), Motrin (ibuprofen) for additional pain control during fracture healing can delay and/or prevent the healing response.  If you would like to take over the counter (OTC) medication, Tylenol (acetaminophen) is ok.  However, some narcotic medications that are given for pain control contain acetaminophen as well. Therefore, you should not exceed more than 4000 mg of tylenol in a day if you do not have liver disease.  Also note that there are may OTC medicines, such as cold medicines and allergy medicines that my contain tylenol as well.  If you have any questions about medications and/or interactions please ask your doctor/PA or your pharmacist.      ICE AND ELEVATE INJURED/OPERATIVE EXTREMITY  Using ice and elevating the injured extremity above your heart can help with swelling and pain control.  Icing in a pulsatile fashion, such as 20 minutes on and 20 minutes off, can be followed.    Do not place ice directly on skin. Make sure there is a barrier between to skin and the ice pack.    Using frozen items such  as frozen peas works well as the conform nicely to the are that needs to be iced.  USE AN ACE WRAP OR TED HOSE FOR SWELLING CONTROL  In addition to icing and elevation, Ace wraps or TED hose are used to help limit and resolve swelling.  It is recommended to use  Ace wraps or TED hose until you are informed to stop.    When using Ace Wraps start the wrapping distally (farthest away from the body) and wrap proximally (closer to the body)   Example: If you had surgery on your leg or thing and you do not have a splint on, start the ace wrap at the toes and work your way up to the thigh        If you had surgery on your upper extremity and do not have a splint on, start the ace wrap at your fingers and work your way up to the upper arm  IF YOU ARE IN A SPLINT OR CAST DO NOT REMOVE IT FOR ANY REASON   If your splint gets wet for any reason please contact the office immediately. You may shower in your splint or cast as long as you keep it dry.  This can be done by wrapping in a cast cover or garbage back (or similar)  Do Not stick any thing down your splint or cast such as pencils, money, or hangers to try and scratch yourself with.  If you feel itchy take benadryl as prescribed on the bottle for itching  IF YOU ARE IN A CAM BOOT (BLACK BOOT)  You may remove boot periodically. Perform daily dressing changes as noted below.  Wash the liner of the boot regularly and wear a sock when wearing the boot. It is recommended that you sleep in the boot until told otherwise  CALL THE OFFICE WITH ANY QUESTIONS OR CONCERTS: [phone]    Discharge Wound Care Instructions  Do NOT apply any ointments, solutions or lotions to pin sites or surgical wounds.  These prevent needed drainage and even though solutions like hydrogen peroxide kill bacteria, they also damage cells lining the pin sites that help fight infection.  Applying lotions or ointments can keep the wounds moist and can cause them to breakdown and open up as well. This can increase the risk for infection. When in doubt call the office.  Surgical incisions should be dressed daily.  If any drainage is noted, use one layer of adaptic, then gauze, Kerlix, and an ace wrap.  Once the incision is completely dry and  without drainage, it may be left open to air out.  Showering may begin 36-48 hours later.  Cleaning gently with soap and water.  Traumatic wounds should be dressed daily as well.    One layer of adaptic, gauze, Kerlix, then ace wrap.  The adaptic can be discontinued once the draining has ceased    If you have a wet to dry dressing: wet the gauze with saline the squeeze as much saline out so the gauze is moist (not soaking wet), place moistened gauze over wound, then place a dry gauze over the moist one, followed by Kerlix wrap, then ace wrap.
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
## 2015-06-14 ENCOUNTER — Inpatient Hospital Stay (HOSPITAL_COMMUNITY): Payer: BC Managed Care – PPO

## 2015-06-14 LAB — BASIC METABOLIC PANEL
ANION GAP: 8 (ref 5–15)
BUN: 10 mg/dL (ref 6–20)
CHLORIDE: 98 mmol/L — AB (ref 101–111)
CO2: 28 mmol/L (ref 22–32)
Calcium: 8.3 mg/dL — ABNORMAL LOW (ref 8.9–10.3)
Creatinine, Ser: 1.09 mg/dL (ref 0.61–1.24)
GFR calc Af Amer: >60 mL/min (ref >60)
GLUCOSE: 124 mg/dL — AB (ref 65–99)
POTASSIUM: 3.3 mmol/L — AB (ref 3.5–5.1)
Sodium: 134 mmol/L — ABNORMAL LOW (ref 135–145)

## 2015-06-14 MED ORDER — CEFAZOLIN SODIUM-DEXTROSE 2-3 GM-% IV SOLR
2.0000 g | INTRAVENOUS | Status: AC
  Administered 2015-06-15: 2 g via INTRAVENOUS
  Filled 2015-06-14: qty 50

## 2015-06-14 NOTE — Progress Notes (Signed)
Orthopedic Tech Progress Note Patient Details:  Hunter [name] 12-27-1975 [id number]  Ortho Devices Type of Ortho Device: Crutches Ortho Device/Splint Interventions: Application   Nikki Dom 06/14/2015, 12:19 PM
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
## 2015-06-14 NOTE — Progress Notes (Signed)
MRI demonstrates intact quad tendon. Scanogram demonstrates 15 degrees of physiologic internal rotation on the uninjured left side and 15 degrees of external rotation on the right side.  PLAN: 1. ORIF of plateau   2. Derotation with relocking of distal locking bolts.  Myrene Galas, MD [facility] [phone] (p)

## 2015-06-15 ENCOUNTER — Inpatient Hospital Stay (HOSPITAL_COMMUNITY): Payer: BC Managed Care – PPO

## 2015-06-15 ENCOUNTER — Encounter (HOSPITAL_COMMUNITY): Payer: Self-pay | Admitting: Anesthesiology

## 2015-06-15 ENCOUNTER — Encounter (HOSPITAL_COMMUNITY)
Admission: EM | Disposition: A | Discharge status: Home Health | Payer: Self-pay | Source: Home / Self Care | Attending: Orthopedic Surgery

## 2015-06-15 ENCOUNTER — Inpatient Hospital Stay (HOSPITAL_COMMUNITY): Payer: BC Managed Care – PPO | Admitting: Anesthesiology

## 2015-06-15 HISTORY — PX: ORIF TIBIA PLATEAU: SHX2132

## 2015-06-15 SURGERY — OPEN REDUCTION INTERNAL FIXATION (ORIF) TIBIAL PLATEAU
Anesthesia: General | Site: Leg Upper | Laterality: Right

## 2015-06-15 MED ORDER — DEXAMETHASONE SODIUM PHOSPHATE 10 MG/ML IJ SOLN
INTRAMUSCULAR | Status: DC | PRN
  Administered 2015-06-15: 10 mg via INTRAVENOUS

## 2015-06-15 MED ORDER — SUCCINYLCHOLINE CHLORIDE 20 MG/ML IJ SOLN
INTRAMUSCULAR | Status: AC
  Filled 2015-06-15: qty 1

## 2015-06-15 MED ORDER — SODIUM CHLORIDE 0.9 % IJ SOLN
INTRAMUSCULAR | Status: AC
  Filled 2015-06-15: qty 10

## 2015-06-15 MED ORDER — HYDROMORPHONE HCL 1 MG/ML IJ SOLN
0.2500 mg | INTRAMUSCULAR | Status: DC | PRN
  Administered 2015-06-15 (×4): 0.5 mg via INTRAVENOUS

## 2015-06-15 MED ORDER — ONDANSETRON HCL 4 MG/2ML IJ SOLN
INTRAMUSCULAR | Status: AC
  Filled 2015-06-15: qty 2

## 2015-06-15 MED ORDER — ONDANSETRON HCL 4 MG/2ML IJ SOLN
INTRAMUSCULAR | Status: DC | PRN
  Administered 2015-06-15: 4 mg via INTRAVENOUS

## 2015-06-15 MED ORDER — MEPERIDINE HCL 25 MG/ML IJ SOLN: 6.2500 mg | INTRAMUSCULAR | Status: DC | PRN

## 2015-06-15 MED ORDER — LACTATED RINGERS IV SOLN
INTRAVENOUS | Status: DC
  Administered 2015-06-15: 07:00:00 via INTRAVENOUS

## 2015-06-15 MED ORDER — PROPOFOL 10 MG/ML IV BOLUS
INTRAVENOUS | Status: AC
  Filled 2015-06-15: qty 20

## 2015-06-15 MED ORDER — CEFAZOLIN SODIUM-DEXTROSE 2-3 GM-% IV SOLR
2.0000 g | Freq: Three times a day (TID) | INTRAVENOUS | Status: AC
  Administered 2015-06-15 – 2015-06-16 (×3): 2 g via INTRAVENOUS
  Filled 2015-06-15 (×4): qty 50

## 2015-06-15 MED ORDER — DEXAMETHASONE SODIUM PHOSPHATE 10 MG/ML IJ SOLN
INTRAMUSCULAR | Status: AC
  Filled 2015-06-15: qty 1

## 2015-06-15 MED ORDER — MIDAZOLAM HCL 5 MG/5ML IJ SOLN
INTRAMUSCULAR | Status: DC | PRN
  Administered 2015-06-15: 2 mg via INTRAVENOUS

## 2015-06-15 MED ORDER — 0.9 % SODIUM CHLORIDE (POUR BTL) OPTIME
TOPICAL | Status: DC | PRN
  Administered 2015-06-15: 1000 mL

## 2015-06-15 MED ORDER — BUPIVACAINE-EPINEPHRINE (PF) 0.25% -1:200000 IJ SOLN
INTRAMUSCULAR | Status: AC
  Filled 2015-06-15: qty 30

## 2015-06-15 MED ORDER — OXYCODONE HCL 5 MG PO TABS: 5.0000 mg | ORAL_TABLET | Freq: Once | ORAL | Status: DC | PRN

## 2015-06-15 MED ORDER — EPHEDRINE SULFATE 50 MG/ML IJ SOLN
INTRAMUSCULAR | Status: AC
  Filled 2015-06-15: qty 1

## 2015-06-15 MED ORDER — FENTANYL CITRATE (PF) 250 MCG/5ML IJ SOLN
INTRAMUSCULAR | Status: AC
  Filled 2015-06-15: qty 5

## 2015-06-15 MED ORDER — BUPIVACAINE-EPINEPHRINE 0.25% -1:200000 IJ SOLN
INTRAMUSCULAR | Status: DC | PRN
  Administered 2015-06-15: 10 mL

## 2015-06-15 MED ORDER — GLYCOPYRROLATE 0.2 MG/ML IJ SOLN
INTRAMUSCULAR | Status: AC
  Filled 2015-06-15: qty 3

## 2015-06-15 MED ORDER — ROCURONIUM BROMIDE 50 MG/5ML IV SOLN
INTRAVENOUS | Status: AC
  Filled 2015-06-15: qty 2

## 2015-06-15 MED ORDER — PHENYLEPHRINE 40 MCG/ML (10ML) SYRINGE FOR IV PUSH (FOR BLOOD PRESSURE SUPPORT)
PREFILLED_SYRINGE | INTRAVENOUS | Status: AC
  Filled 2015-06-15: qty 10

## 2015-06-15 MED ORDER — PROPOFOL 10 MG/ML IV BOLUS
INTRAVENOUS | Status: DC | PRN
  Administered 2015-06-15: 400 mg via INTRAVENOUS

## 2015-06-15 MED ORDER — LIDOCAINE HCL (CARDIAC) 20 MG/ML IV SOLN
INTRAVENOUS | Status: DC | PRN
  Administered 2015-06-15: 100 mg via INTRAVENOUS

## 2015-06-15 MED ORDER — NEOSTIGMINE METHYLSULFATE 10 MG/10ML IV SOLN
INTRAVENOUS | Status: DC | PRN
  Administered 2015-06-15: 4 mg via INTRAVENOUS

## 2015-06-15 MED ORDER — FENTANYL CITRATE (PF) 100 MCG/2ML IJ SOLN
INTRAMUSCULAR | Status: DC | PRN
  Administered 2015-06-15: 100 ug via INTRAVENOUS
  Administered 2015-06-15: 50 ug via INTRAVENOUS
  Administered 2015-06-15: 150 ug via INTRAVENOUS
  Administered 2015-06-15 (×2): 50 ug via INTRAVENOUS

## 2015-06-15 MED ORDER — MIDAZOLAM HCL 2 MG/2ML IJ SOLN
INTRAMUSCULAR | Status: AC
  Filled 2015-06-15: qty 4

## 2015-06-15 MED ORDER — GLYCOPYRROLATE 0.2 MG/ML IJ SOLN
INTRAMUSCULAR | Status: DC | PRN
  Administered 2015-06-15: 0.6 mg via INTRAVENOUS

## 2015-06-15 MED ORDER — OXYCODONE HCL 5 MG/5ML PO SOLN: 5.0000 mg | Freq: Once | ORAL | Status: DC | PRN

## 2015-06-15 MED ORDER — ROCURONIUM BROMIDE 100 MG/10ML IV SOLN
INTRAVENOUS | Status: DC | PRN
  Administered 2015-06-15: 50 mg via INTRAVENOUS
  Administered 2015-06-15: 10 mg via INTRAVENOUS
  Administered 2015-06-15: 20 mg via INTRAVENOUS

## 2015-06-15 MED ORDER — LIDOCAINE HCL (CARDIAC) 20 MG/ML IV SOLN
INTRAVENOUS | Status: AC
Start: 2015-06-15 — End: 2015-06-15
  Filled 2015-06-15: qty 5

## 2015-06-15 MED ORDER — HYDROMORPHONE HCL 1 MG/ML IJ SOLN
INTRAMUSCULAR | Status: AC
  Administered 2015-06-15: 0.5 mg via INTRAVENOUS
  Filled 2015-06-15: qty 2

## 2015-06-15 MED ORDER — LACTATED RINGERS IV SOLN
INTRAVENOUS | Status: DC | PRN
  Administered 2015-06-15 (×2): via INTRAVENOUS

## 2015-06-15 SURGICAL SUPPLY — 83 items
BANDAGE ELASTIC 4 VELCRO ST LF (GAUZE/BANDAGES/DRESSINGS) ×3 IMPLANT
BANDAGE ELASTIC 6 VELCRO ST LF (GAUZE/BANDAGES/DRESSINGS) ×3 IMPLANT
BANDAGE ESMARK 6X9 LF (GAUZE/BANDAGES/DRESSINGS) ×1 IMPLANT
BIT DRILL 4.9 CANNULATED (BIT) ×1
BIT DRILL AO GAMMA 4.2X130 (BIT) ×3 IMPLANT
BIT DRILL CANN QC 4.9 LRG (BIT) ×1 IMPLANT
BLADE SURG 10 STRL SS (BLADE) ×3 IMPLANT
BLADE SURG 15 STRL LF DISP TIS (BLADE) ×1 IMPLANT
BLADE SURG 15 STRL SS (BLADE) ×2
BLADE SURG ROTATE 9660 (MISCELLANEOUS) IMPLANT
BNDG COHESIVE 4X5 TAN STRL (GAUZE/BANDAGES/DRESSINGS) ×3 IMPLANT
BNDG ESMARK 6X9 LF (GAUZE/BANDAGES/DRESSINGS) ×3
BNDG GAUZE ELAST 4 BULKY (GAUZE/BANDAGES/DRESSINGS) ×3 IMPLANT
BRUSH SCRUB DISP (MISCELLANEOUS) ×6 IMPLANT
CANISTER SUCT 3000ML PPV (MISCELLANEOUS) ×3 IMPLANT
COVER MAYO STAND STRL (DRAPES) ×3 IMPLANT
COVER SURGICAL LIGHT HANDLE (MISCELLANEOUS) ×3 IMPLANT
DRAPE C-ARM 42X72 X-RAY (DRAPES) ×3 IMPLANT
DRAPE C-ARMOR (DRAPES) ×3 IMPLANT
DRAPE EXTREMITY T 121X128X90 (DRAPE) IMPLANT
DRAPE INCISE IOBAN 66X45 STRL (DRAPES) ×3 IMPLANT
DRAPE ORTHO SPLIT 77X108 STRL (DRAPES)
DRAPE SURG ORHT 6 SPLT 77X108 (DRAPES) IMPLANT
DRAPE U-SHAPE 47X51 STRL (DRAPES) ×3 IMPLANT
DRILL BIT CANNULATED 4.9 (BIT) ×2
DRSG ADAPTIC 3X8 NADH LF (GAUZE/BANDAGES/DRESSINGS) ×3 IMPLANT
DRSG MEPILEX BORDER 4X4 (GAUZE/BANDAGES/DRESSINGS) ×6 IMPLANT
DRSG PAD ABDOMINAL 8X10 ST (GAUZE/BANDAGES/DRESSINGS) ×12 IMPLANT
ELECT REM PT RETURN 9FT ADLT (ELECTROSURGICAL) ×3
ELECTRODE REM PT RTRN 9FT ADLT (ELECTROSURGICAL) ×1 IMPLANT
EVACUATOR 1/8 PVC DRAIN (DRAIN) IMPLANT
EVACUATOR 3/16  PVC DRAIN (DRAIN)
EVACUATOR 3/16 PVC DRAIN (DRAIN) IMPLANT
GAUZE SPONGE 4X4 12PLY STRL (GAUZE/BANDAGES/DRESSINGS) ×3 IMPLANT
GLOVE BIO SURGEON STRL SZ7.5 (GLOVE) ×3 IMPLANT
GLOVE BIO SURGEON STRL SZ8 (GLOVE) ×3 IMPLANT
GLOVE BIOGEL PI IND STRL 7.5 (GLOVE) ×1 IMPLANT
GLOVE BIOGEL PI IND STRL 8 (GLOVE) ×1 IMPLANT
GLOVE BIOGEL PI INDICATOR 7.5 (GLOVE) ×2
GLOVE BIOGEL PI INDICATOR 8 (GLOVE) ×2
GOWN STRL REUS W/ TWL LRG LVL3 (GOWN DISPOSABLE) ×2 IMPLANT
GOWN STRL REUS W/ TWL XL LVL3 (GOWN DISPOSABLE) ×1 IMPLANT
GOWN STRL REUS W/TWL LRG LVL3 (GOWN DISPOSABLE) ×4
GOWN STRL REUS W/TWL XL LVL3 (GOWN DISPOSABLE) ×2
GUIDEWIRE THRD ASNIS 3.2X300 (WIRE) ×9 IMPLANT
IMMOBILIZER KNEE 22 UNIV (SOFTGOODS) ×3 IMPLANT
KIT BASIN OR (CUSTOM PROCEDURE TRAY) ×3 IMPLANT
KIT ROOM TURNOVER OR (KITS) ×3 IMPLANT
NDL SUT 6 .5 CRC .975X.05 MAYO (NEEDLE) IMPLANT
NEEDLE 22X1 1/2 (OR ONLY) (NEEDLE) IMPLANT
NEEDLE MAYO TAPER (NEEDLE)
NS IRRIG 1000ML POUR BTL (IV SOLUTION) ×3 IMPLANT
PACK ORTHO EXTREMITY (CUSTOM PROCEDURE TRAY) ×3 IMPLANT
PAD ARMBOARD 7.5X6 YLW CONV (MISCELLANEOUS) ×6 IMPLANT
PAD CAST 4YDX4 CTTN HI CHSV (CAST SUPPLIES) ×1 IMPLANT
PADDING CAST COTTON 4X4 STRL (CAST SUPPLIES) ×2
PADDING CAST COTTON 6X4 STRL (CAST SUPPLIES) ×3 IMPLANT
SCREW CANNULATED 70MMX6.5MM (Screw) ×3 IMPLANT
SCREW CANNULATED 80MMX6.5MM (Screw) ×3 IMPLANT
SCREW CANNULATED 85MMX6.5MM (Screw) ×3 IMPLANT
SCREW GAMMA (Screw) ×3 IMPLANT
SCREW LOCKING T2 F/T  5MMX65MM (Screw) ×2 IMPLANT
SCREW LOCKING T2 F/T 5MMX65MM (Screw) ×1 IMPLANT
SPONGE LAP 18X18 X RAY DECT (DISPOSABLE) ×3 IMPLANT
STAPLER VISISTAT 35W (STAPLE) ×3 IMPLANT
STOCKINETTE IMPERVIOUS LG (DRAPES) ×3 IMPLANT
SUCTION FRAZIER TIP 10 FR DISP (SUCTIONS) ×3 IMPLANT
SUT ETHILON 3 0 PS 1 (SUTURE) IMPLANT
SUT PROLENE 0 CT 2 (SUTURE) ×6 IMPLANT
SUT VIC AB 0 CT1 27 (SUTURE) ×2
SUT VIC AB 0 CT1 27XBRD ANBCTR (SUTURE) ×1 IMPLANT
SUT VIC AB 1 CT1 27 (SUTURE) ×2
SUT VIC AB 1 CT1 27XBRD ANBCTR (SUTURE) ×1 IMPLANT
SUT VIC AB 2-0 CT1 27 (SUTURE) ×4
SUT VIC AB 2-0 CT1 TAPERPNT 27 (SUTURE) ×2 IMPLANT
SYR 20ML ECCENTRIC (SYRINGE) IMPLANT
TOWEL OR 17X24 6PK STRL BLUE (TOWEL DISPOSABLE) ×3 IMPLANT
TOWEL OR 17X26 10 PK STRL BLUE (TOWEL DISPOSABLE) ×6 IMPLANT
TRAY FOLEY CATH 16FRSI W/METER (SET/KITS/TRAYS/PACK) IMPLANT
TUBE CONNECTING 12'X1/4 (SUCTIONS) ×1
TUBE CONNECTING 12X1/4 (SUCTIONS) ×2 IMPLANT
WATER STERILE IRR 1000ML POUR (IV SOLUTION) ×6 IMPLANT
YANKAUER SUCT BULB TIP NO VENT (SUCTIONS) ×3 IMPLANT

## 2015-06-15 NOTE — Anesthesia Preprocedure Evaluation (Signed)
Anesthesia Evaluation  Patient identified by MRN, date of birth, ID band Patient awake    Reviewed: Allergy & Precautions, NPO status , Patient's Chart, lab work & pertinent test results  Airway Mallampati: I  TM Distance: >3 FB Neck ROM: Full    Dental  (+) Teeth Intact, Dental Advisory Given   Pulmonary Current Smoker,    breath sounds clear to auscultation- rhonchi       Cardiovascular  Rhythm:Regular Rate:Normal     Neuro/Psych    GI/Hepatic   Endo/Other  Morbid obesity  Renal/GU      Musculoskeletal   Abdominal   Peds  Hematology   Anesthesia Other Findings   Reproductive/Obstetrics                             Anesthesia Physical Anesthesia Plan  ASA: I  Anesthesia Plan: General   Post-op Pain Management:    Induction: Intravenous  Airway Management Planned: Oral ETT  Additional Equipment:   Intra-op Plan:   Post-operative Plan: Extubation in OR  Informed Consent: I have reviewed the patients History and Physical, chart, labs and discussed the procedure including the risks, benefits and alternatives for the proposed anesthesia with the patient or authorized representative who has indicated his/her understanding and acceptance.   Dental advisory given  Plan Discussed with: CRNA, Anesthesiologist and Surgeon  Anesthesia Plan Comments:         Anesthesia Quick Evaluation

## 2015-06-15 NOTE — Anesthesia Postprocedure Evaluation (Signed)
  Anesthesia Post-op Note  Patient: Hunter [name]  Procedure(s) Performed: Procedure(s): DE-ROTATE HIP AND REMOVE SCREWS REPLACE SCREWS, OPEN REDUCTION INTERNAL FIXATION (ORIF) TIBIAL PLATEAU (Right)  Patient Location: PACU  Anesthesia Type: General   Level of Consciousness: awake, alert  and oriented  Airway and Oxygen Therapy: Patient Spontanous Breathing  Post-op Pain: mild  Post-op Assessment: Post-op Vital signs reviewed  Post-op Vital Signs: Reviewed  Last Vitals:  Filed Vitals:   06/15/15 1013  BP: 152/84  Pulse: 88  Temp:   Resp: 15    Complications: No apparent anesthesia complications

## 2015-06-15 NOTE — Anesthesia Procedure Notes (Signed)
Procedure Name: Intubation Date/Time: 06/15/2015 7:54 AM Performed by: Leonel Ramsay Pre-anesthesia Checklist: Patient identified, Emergency Drugs available, Suction available, Patient being monitored and Timeout performed Patient Re-evaluated:Patient Re-evaluated prior to inductionOxygen Delivery Method: Circle system utilized Preoxygenation: Pre-oxygenation with 100% oxygen Intubation Type: IV induction Ventilation: Mask ventilation without difficulty and Oral airway inserted - appropriate to patient size Laryngoscope Size: Mac and 4 Grade View: Grade II Tube type: Oral Tube size: 7.5 mm Number of attempts: 1 Airway Equipment and Method: Stylet and Oral airway Placement Confirmation: ETT inserted through vocal cords under direct vision,  positive ETCO2 and breath sounds checked- equal and bilateral Secured at: 24 cm Tube secured with: Tape Dental Injury: Teeth and Oropharynx as per pre-operative assessment

## 2015-06-15 NOTE — Brief Op Note (Addendum)
06/10/2015 - 06/15/2015  10:14 AM  PATIENT:  Hunter [name]  39 y.o. male  PRE-OPERATIVE DIAGNOSIS:   1. RIGHT LATERAL TIBIAL PLATEAU FRACTURE 2. RIGHT FEMUR FRACTURE MALROTATION  POST-OPERATIVE DIAGNOSIS:   1. RIGHT LATERAL TIBIAL PLATEAU FRACTURE 2. RIGHT FEMUR FRACTURE MALROTATION  PROCEDURE:  Procedure(s): 1. OPEN REDUCTION INTERNAL FIXATION (ORIF) LATERAL TIBIAL PLATEAU (Right) 2. REVISION REDUCTION FEMORAL FRACTURE WITH REMOVAL AND REPLACEMENT OF DISTAL LOCKING SCREWS  SURGEON:  Surgeon(s) and Role:    * Myrene Galas, MD - Primary  PHYSICIAN ASSISTANT: None  ANESTHESIA:   general  I/O:     SPECIMEN:  No Specimen  TOURNIQUET:  * No tourniquets in log *  DICTATION: .Other Dictation: Dictation Number [id number]

## 2015-06-15 NOTE — Transfer of Care (Signed)
Immediate Anesthesia Transfer of Care Note  Patient: Hunter [name]  Procedure(s) Performed: Procedure(s): DE-ROTATE HIP AND REMOVE SCREWS REPLACE SCREWS, OPEN REDUCTION INTERNAL FIXATION (ORIF) TIBIAL PLATEAU (Right)  Patient Location: PACU  Anesthesia Type:General  Level of Consciousness: awake, alert  and oriented  Airway & Oxygen Therapy: Patient Spontanous Breathing and Patient connected to nasal cannula oxygen  Post-op Assessment: Report given to RN and Post -op Vital signs reviewed and stable  Post vital signs: Reviewed and stable  Last Vitals:  Filed Vitals:   06/15/15 0550  BP: 156/83  Pulse: 82  Temp: 37.2 C  Resp: 16    Complications: No apparent anesthesia complications
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
## 2015-06-16 NOTE — Progress Notes (Signed)
Patient could not have bowel movement this shift.  BS hypoactive in all quads and c/o nausea.  Eating less than 50% for all meals but slowly gaining appetite back.  Ambulated 100' with rolling walker and x1 assist.  Encouraged to drink plenty of water through out the shift.  Nsg to monitor status changes.
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
## 2015-06-16 NOTE — Progress Notes (Signed)
Patient c/o nausea. Zofran administered PO.

## 2015-06-16 NOTE — Progress Notes (Signed)
Orthopedic Tech Progress Note Patient Details:  Hunter [name] 07-Oct-1975 [id number]  Ortho Devices Type of Ortho Device: Crutches Ortho Device/Splint Interventions: Application   [name] 06/16/2015, 10:35 AM
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
## 2015-06-17 ENCOUNTER — Encounter (HOSPITAL_COMMUNITY): Payer: Self-pay | Admitting: Orthopedic Surgery

## 2015-06-17 DIAGNOSIS — S82143A Displaced bicondylar fracture of unspecified tibia, initial encounter for closed fracture: Secondary | ICD-10-CM | POA: Diagnosis present

## 2015-06-17 HISTORY — DX: Displaced bicondylar fracture of unspecified tibia, initial encounter for closed fracture: S82.143A
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
## 2015-06-17 NOTE — Progress Notes (Signed)
OT Cancellation Note  Patient Details Name: Hunter [name] MRN: [id number] DOB: 1975-12-13   Cancelled Treatment:    Reason Eval/Treat Not Completed: Other (comment) (Pt had not purchased AE yet). Spoke to pt about longer AE due to pts height, pt stated that he didn't think it was necessary and would purchase AE kit today.  Binnie Kand M.S., OTR/L Pager: [phone]  06/17/2015, 9:42 AM

## 2015-06-18 ENCOUNTER — Encounter (HOSPITAL_COMMUNITY): Payer: Self-pay

## 2015-06-24 ENCOUNTER — Encounter (HOSPITAL_COMMUNITY): Payer: Self-pay | Admitting: Orthopedic Surgery

## 2016-07-30 IMAGING — CR DG KNEE 1-2V PORT*R*
2 series · 2 of 2 positions shown · non-contrast
Comparison: MRI 06/12/2015

CLINICAL DATA: Tibial plateau fracture. Scheduled for surgery
tomorrow.

EXAM:
PORTABLE RIGHT KNEE - 1-2 VIEW

[AP]
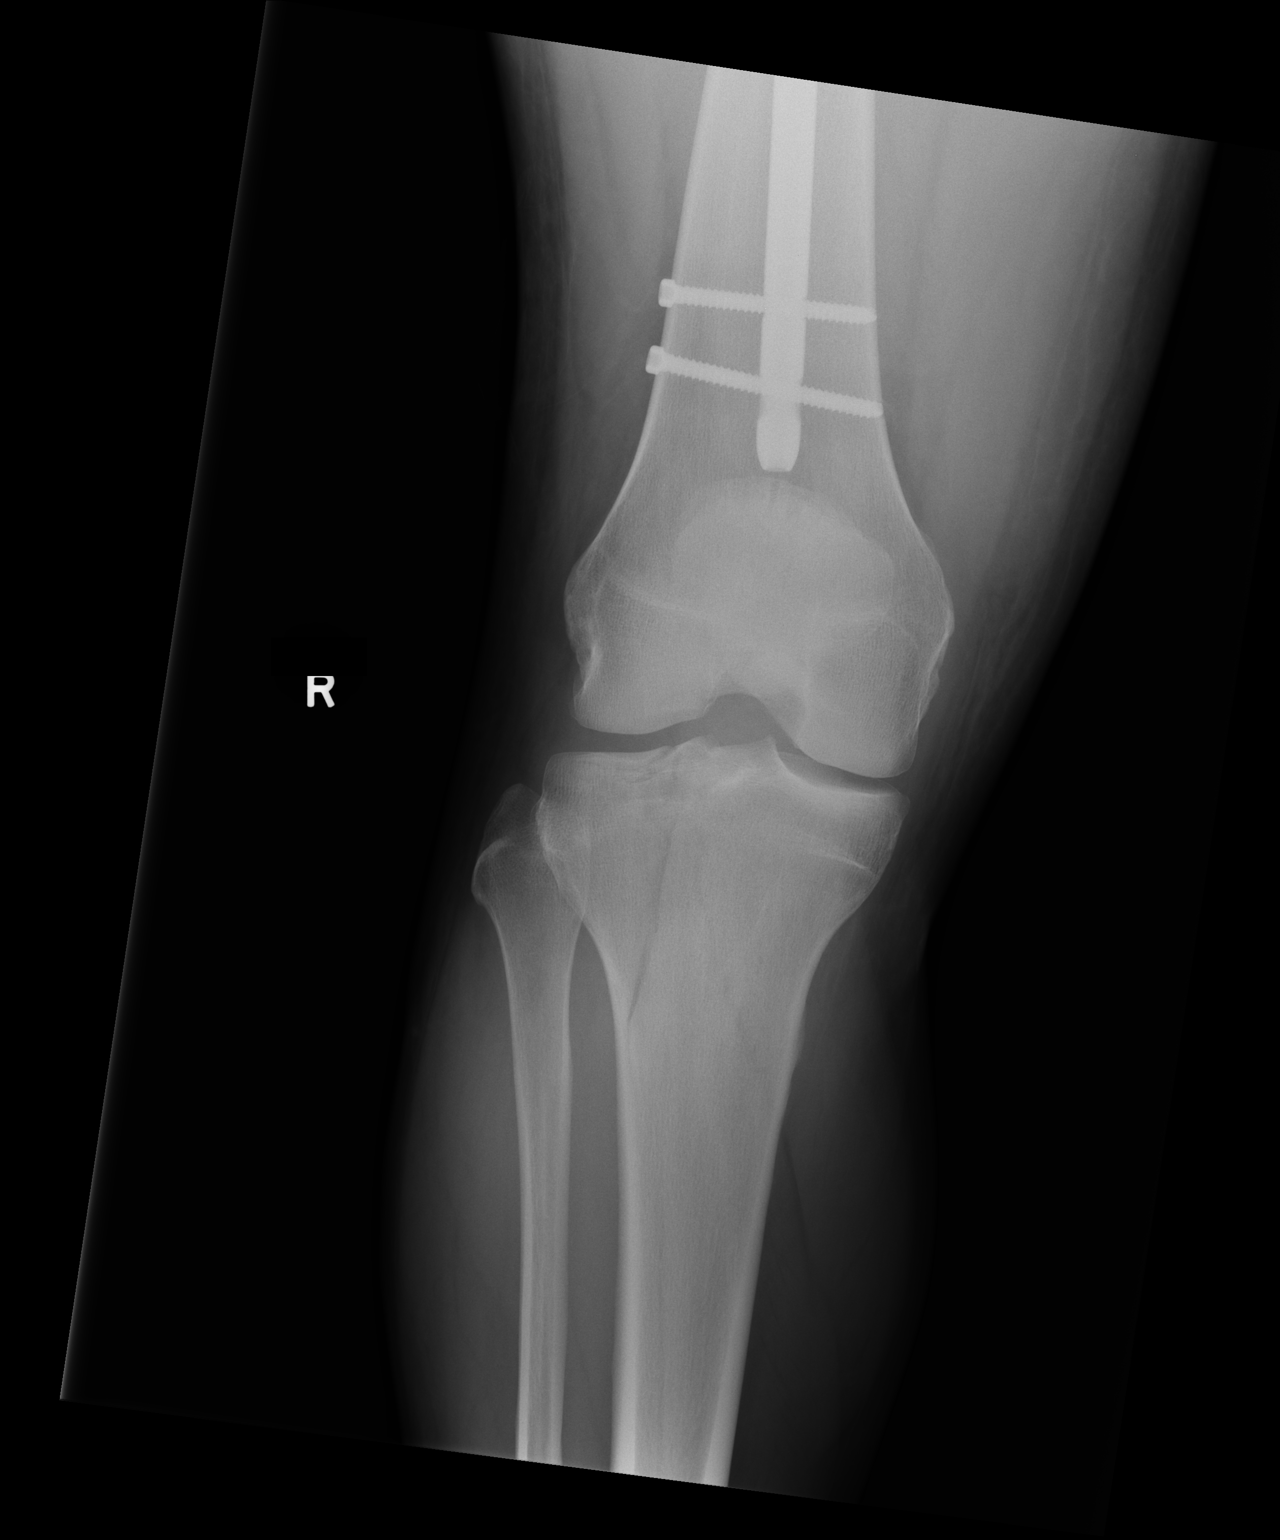

[xtable lateral]
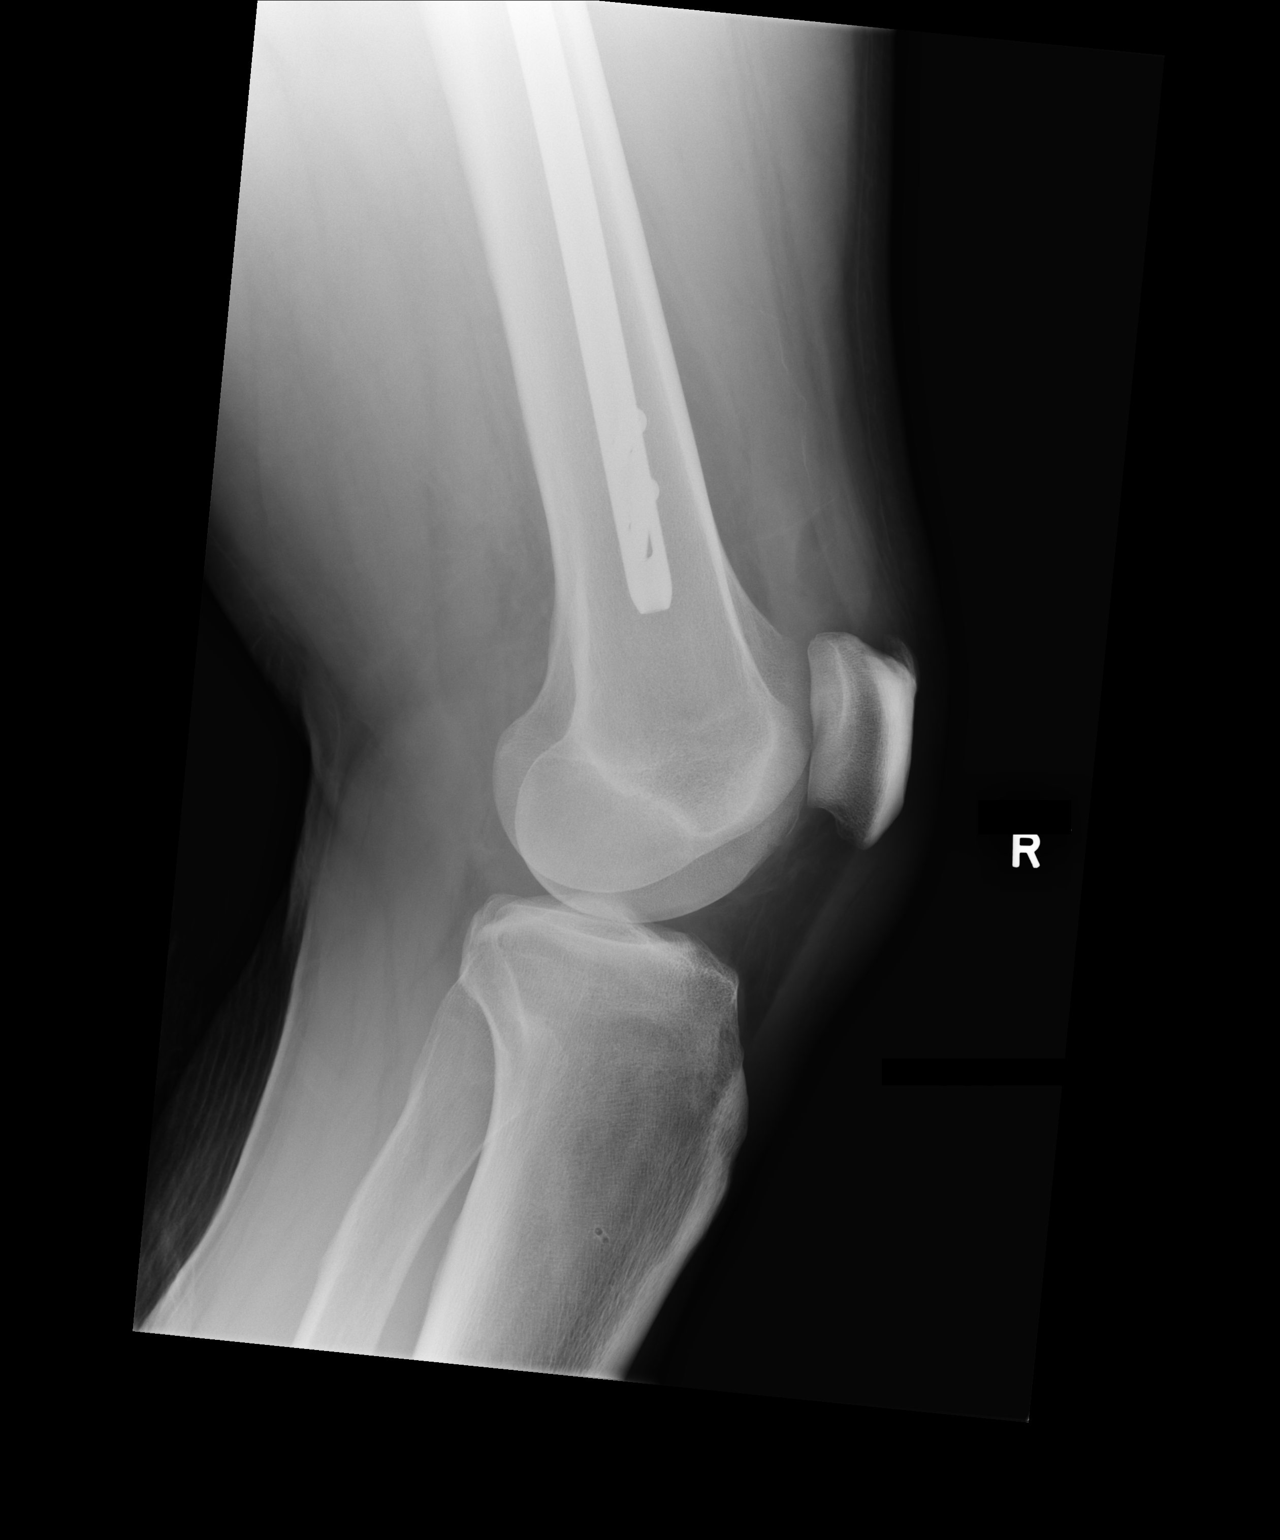

[2 of 2 positions shown; findings below may reference images not displayed]

FINDINGS: Lower portion of intra medullary nail identified in the lower femur
with cortical screws intact. There is a comminuted minimally
displaced fracture of the lateral tibial plateau with minimal
displacement. The appearance is probably not changed since the
previous MRI exam. Small joint effusion present. Mild degenerative
changes at the patellofemoral compartment.
IMPRESSION: Lateral tibial plateau fracture.
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
## 2016-07-31 IMAGING — RF DG C-ARM 61-120 MIN
1 series · 7 of 7 positions shown · non-contrast
Comparison: Plain films 06/14/2015.

CLINICAL DATA: RIGHT Tibial plateau fracture.

EXAM:
DG C-ARM 61-120 MIN; RIGHT FEMUR 2 VIEWS; RIGHT TIBIA AND FIBULA - 2
VIEW

[Series 1: run · 7 of 7 slices shown]
[im 1/7]
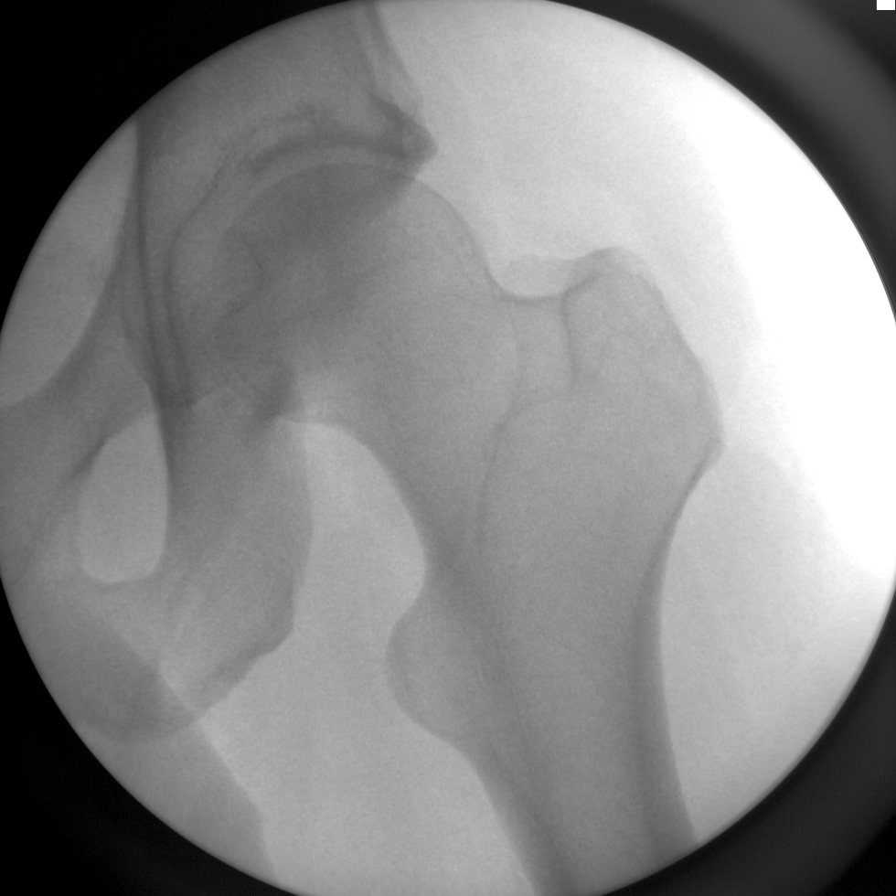
[im 2/7]
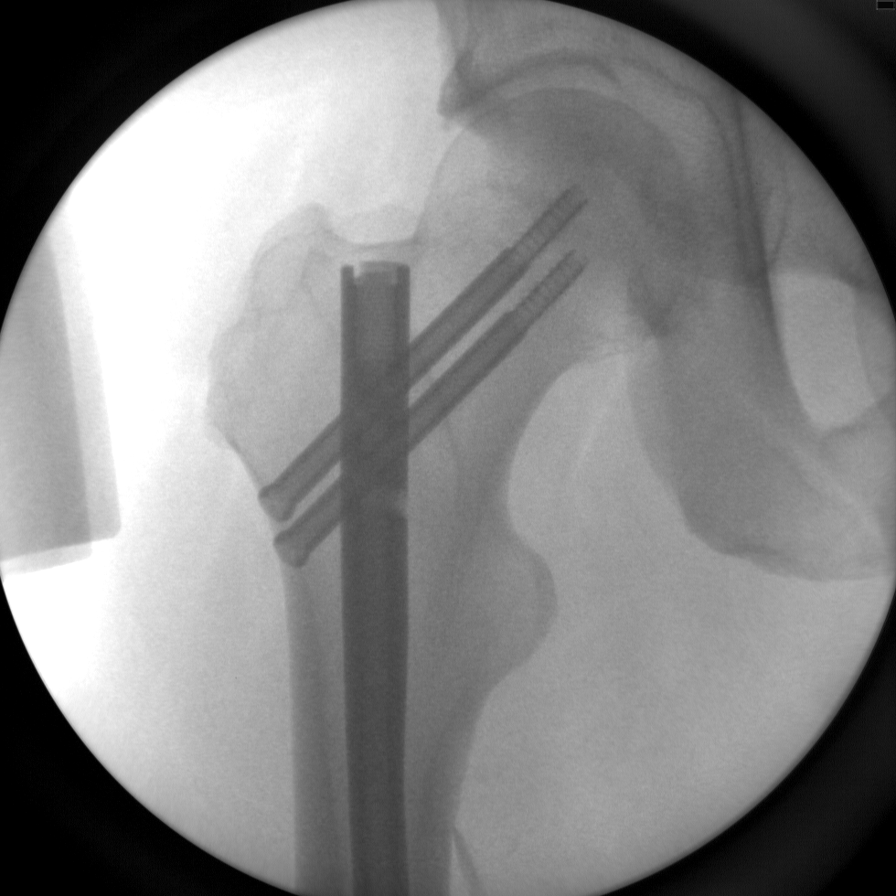
[im 3/7]
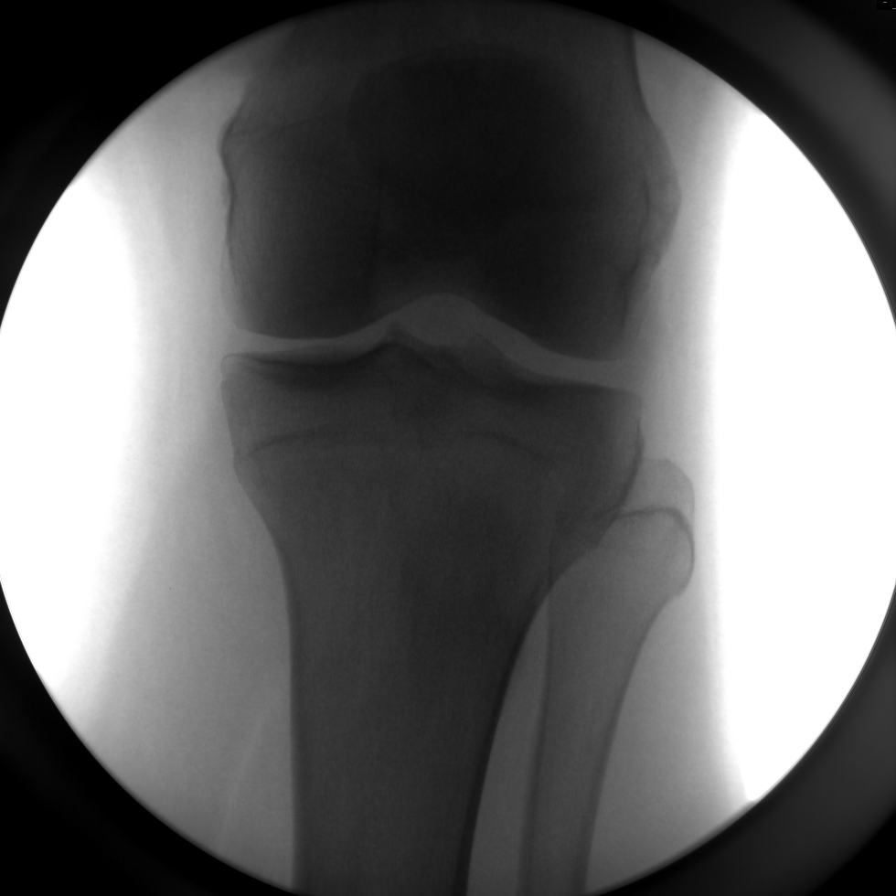
[im 4/7]
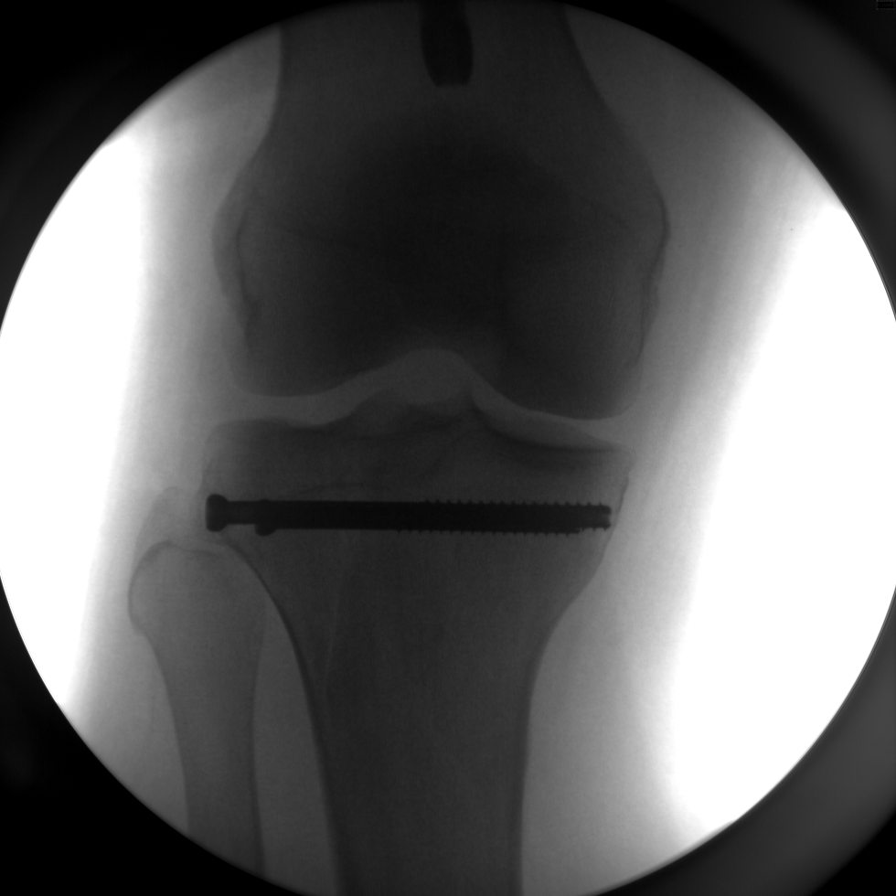
[im 5/7]
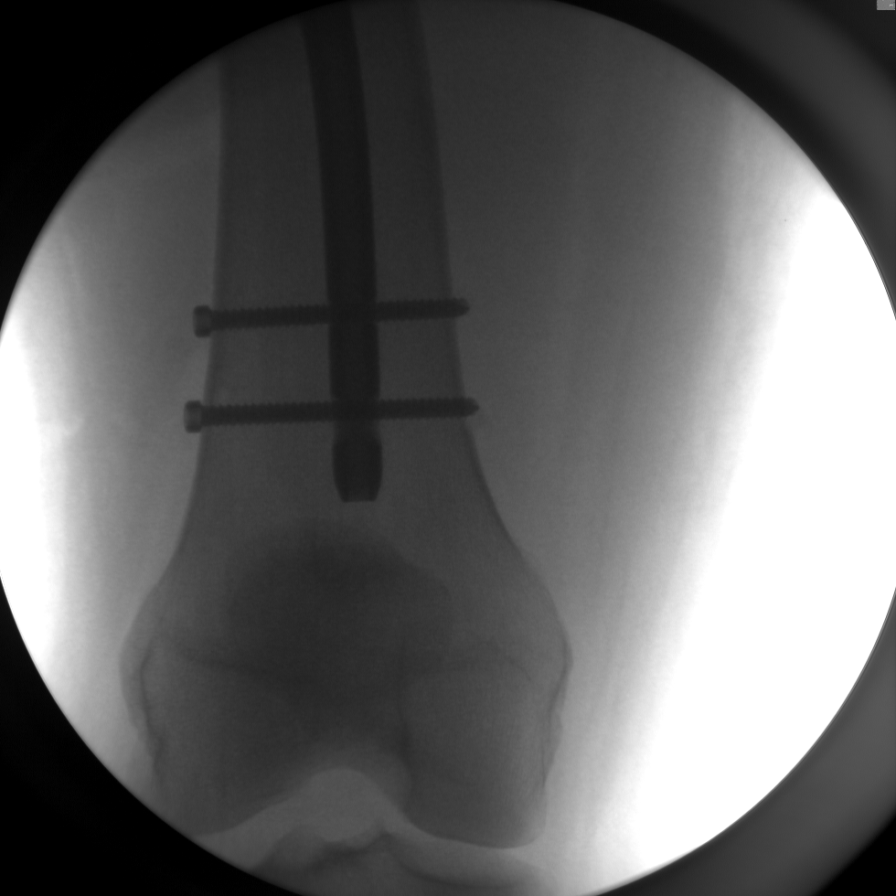
[im 6/7]
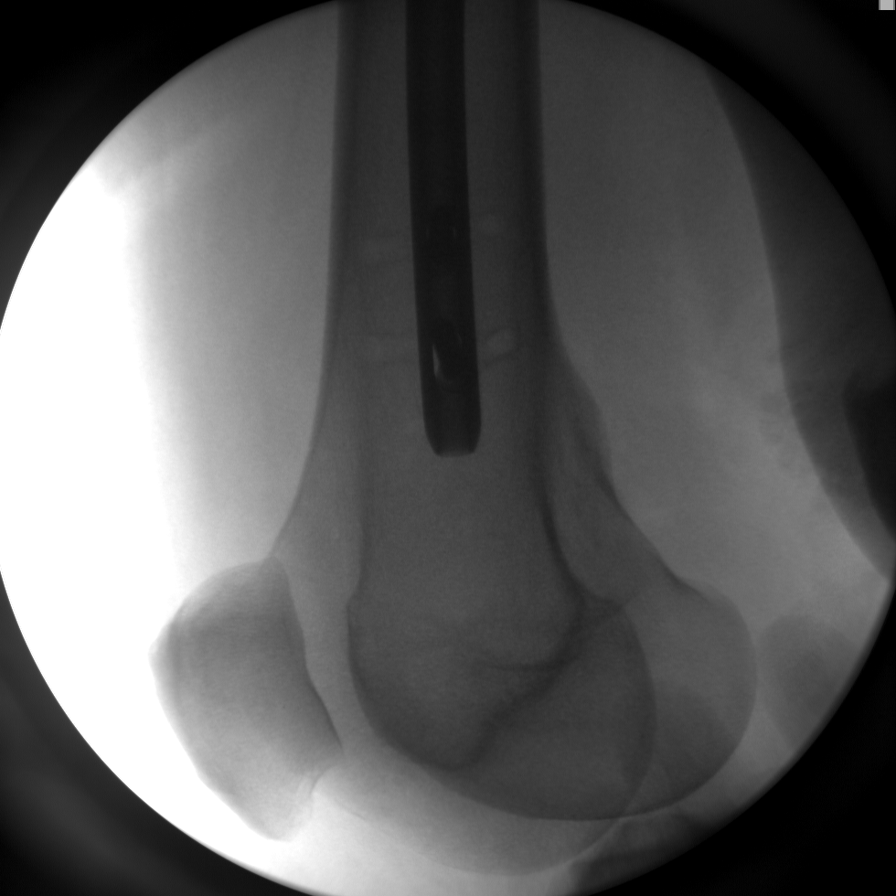
[im 7/7]
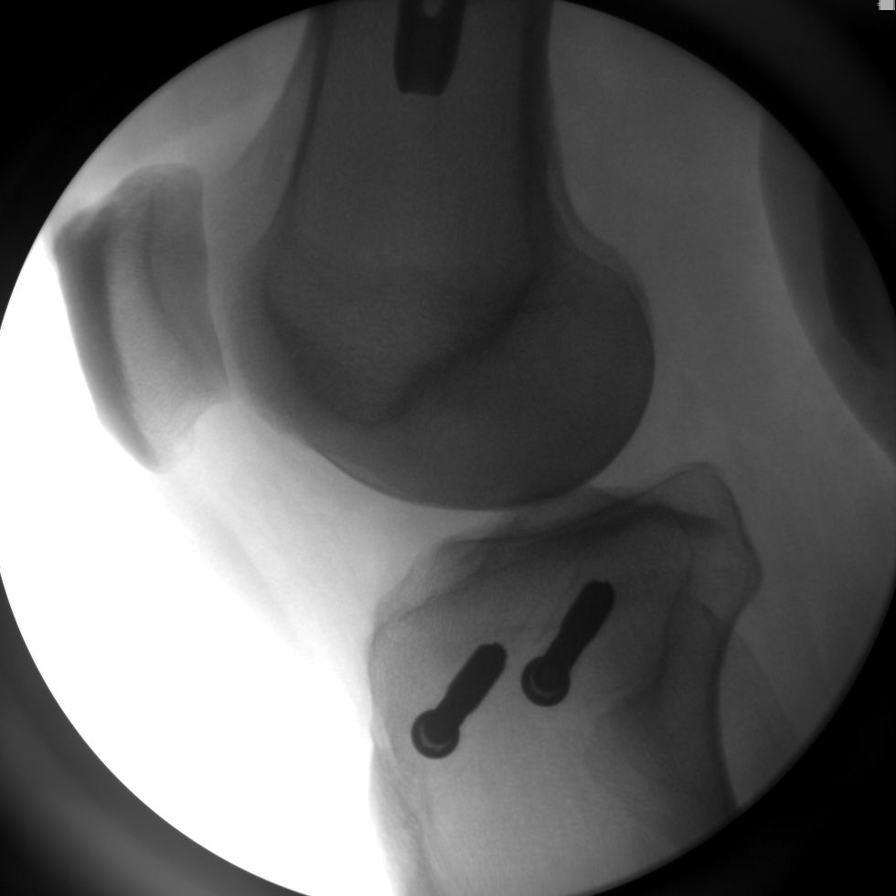

[7 of 7 positions shown; findings below may reference images not displayed]

FINDINGS: RIGHT lateral tibial plateau fracture has been treated with screw
fixation. Screws have been removed from the intra medullary rod in
the femur. Spot films were taken of the LEFT leg for comparison.
IMPRESSION: As above.

## 2019-09-20 ENCOUNTER — Other Ambulatory Visit: Payer: Self-pay

## 2019-09-20 DIAGNOSIS — Z20822 Contact with and (suspected) exposure to covid-19: Secondary | ICD-10-CM

## 2019-09-21 ENCOUNTER — Ambulatory Visit: Payer: BC Managed Care – PPO

## 2019-09-22 LAB — NOVEL CORONAVIRUS, NAA: SARS-CoV-2, NAA: NOT DETECTED

## 2022-06-12 ENCOUNTER — Other Ambulatory Visit: Payer: Self-pay

## 2022-06-12 ENCOUNTER — Encounter (HOSPITAL_BASED_OUTPATIENT_CLINIC_OR_DEPARTMENT_OTHER): Payer: Self-pay

## 2022-06-12 ENCOUNTER — Emergency Department (HOSPITAL_BASED_OUTPATIENT_CLINIC_OR_DEPARTMENT_OTHER): Payer: BC Managed Care – PPO

## 2022-06-12 ENCOUNTER — Emergency Department (HOSPITAL_BASED_OUTPATIENT_CLINIC_OR_DEPARTMENT_OTHER)
Admission: EM | Admit: 2022-06-12 | Discharge: 2022-06-12 | Disposition: A | Discharge status: Home / Self Care | Payer: BC Managed Care – PPO | Attending: Emergency Medicine | Admitting: Emergency Medicine

## 2022-06-12 DIAGNOSIS — R1032 Left lower quadrant pain: Secondary | ICD-10-CM | POA: Diagnosis present

## 2022-06-12 DIAGNOSIS — N23 Unspecified renal colic: Secondary | ICD-10-CM | POA: Insufficient documentation

## 2022-06-12 DIAGNOSIS — R079 Chest pain, unspecified: Secondary | ICD-10-CM | POA: Diagnosis not present

## 2022-06-12 LAB — CBC WITH DIFFERENTIAL/PLATELET
Abs Immature Granulocytes: 0.01 10*3/uL (ref 0.00–0.07)
Basophils Absolute: 0 10*3/uL (ref 0.0–0.1)
Basophils Relative: 0 %
Eosinophils Absolute: 0 10*3/uL (ref 0.0–0.5)
Eosinophils Relative: 1 %
HCT: 44.5 % (ref 39.0–52.0)
Hemoglobin: 14.9 g/dL (ref 13.0–17.0)
Immature Granulocytes: 0 %
Lymphocytes Relative: 28 %
Lymphs Abs: 1.4 10*3/uL (ref 0.7–4.0)
MCH: 28 pg (ref 26.0–34.0)
MCHC: 33.5 g/dL (ref 30.0–36.0)
MCV: 83.5 fL (ref 80.0–100.0)
Monocytes Absolute: 0.3 10*3/uL (ref 0.1–1.0)
Monocytes Relative: 6 %
Neutro Abs: 3.4 10*3/uL (ref 1.7–7.7)
Neutrophils Relative %: 65 %
Platelets: 181 10*3/uL (ref 150–400)
RBC: 5.33 MIL/uL (ref 4.22–5.81)
RDW: 12.5 % (ref 11.5–15.5)
WBC: 5.2 10*3/uL (ref 4.0–10.5)
nRBC: 0 % (ref 0.0–0.2)

## 2022-06-12 LAB — TROPONIN I (HIGH SENSITIVITY)
Troponin I (High Sensitivity): 5 ng/L (ref <18)
Troponin I (High Sensitivity): 5 ng/L (ref <18)

## 2022-06-12 LAB — URINALYSIS, ROUTINE W REFLEX MICROSCOPIC
Bilirubin Urine: NEGATIVE
Glucose, UA: 100 mg/dL — AB
Ketones, ur: NEGATIVE mg/dL
Leukocytes,Ua: NEGATIVE
Nitrite: NEGATIVE
Protein, ur: 30 mg/dL — AB
Specific Gravity, Urine: 1.023 (ref 1.005–1.030)
pH: 5.5 (ref 5.0–8.0)

## 2022-06-12 LAB — BASIC METABOLIC PANEL
Anion gap: 10 (ref 5–15)
BUN: 14 mg/dL (ref 6–20)
CO2: 28 mmol/L (ref 22–32)
Calcium: 9.2 mg/dL (ref 8.9–10.3)
Chloride: 103 mmol/L (ref 98–111)
Creatinine, Ser: 1.31 mg/dL — ABNORMAL HIGH (ref 0.61–1.24)
GFR, Estimated: >60 mL/min (ref >60)
Glucose, Bld: 219 mg/dL — ABNORMAL HIGH (ref 70–99)
Potassium: 3.8 mmol/L (ref 3.5–5.1)
Sodium: 141 mmol/L (ref 135–145)

## 2022-06-12 MED ORDER — OXYCODONE-ACETAMINOPHEN 5-325 MG PO TABS: 1.0000 | ORAL_TABLET | Freq: Four times a day (QID) | ORAL | 0 refills | Status: DC | PRN

## 2022-06-12 MED ORDER — MORPHINE SULFATE (PF) 4 MG/ML IV SOLN
4.0000 mg | Freq: Once | INTRAVENOUS | Status: AC
  Administered 2022-06-12: 4 mg via INTRAVENOUS
  Filled 2022-06-12: qty 1

## 2022-06-12 MED ORDER — SODIUM CHLORIDE 0.9 % IV BOLUS
500.0000 mL | Freq: Once | INTRAVENOUS | Status: AC
  Administered 2022-06-12: 500 mL via INTRAVENOUS

## 2022-06-12 MED ORDER — KETOROLAC TROMETHAMINE 30 MG/ML IJ SOLN
30.0000 mg | Freq: Once | INTRAMUSCULAR | Status: AC
  Administered 2022-06-12: 30 mg via INTRAVENOUS
  Filled 2022-06-12: qty 1

## 2022-06-12 MED ORDER — TAMSULOSIN HCL 0.4 MG PO CAPS: 0.4000 mg | ORAL_CAPSULE | Freq: Every day | ORAL | 0 refills | Status: DC

## 2022-06-12 NOTE — ED Provider Notes (Signed)
[facility] EMERGENCY DEPT Provider Note   CSN: [id number] Arrival date & time: 06/12/22  0827     History  Chief Complaint  Patient presents with   Abdominal Pain   Chest Pain   Testicle Pain    Hunter [name] is a 46 y.o. male.  He has no significant past medical history.  He is complaining of awakening around 4- 5 AM with severe left lower quadrant pain that moved up into his left chest left flank and radiates down to his testicles.  No fevers positive chills.  No nausea or vomiting no urinary symptoms.  No known trauma.  No shortness of breath.  Tried some Pepto-Bismol without any improvement.  Rates the pain as 8 out of 10.  No change with movement.  The history is provided by the patient.  Abdominal Pain Pain location:  LLQ Pain quality: aching   Pain radiates to:  Chest, L flank and scrotum Pain severity:  Severe Onset quality:  Sudden Duration:  4 hours Timing:  Constant Progression:  Unchanged Chronicity:  New Context: not trauma   Relieved by:  Nothing Worsened by:  Nothing Ineffective treatments:  OTC medications Associated symptoms: chest pain and chills   Associated symptoms: no cough, no diarrhea, no dysuria, no fever, no hematuria, no nausea, no shortness of breath, no sore throat and no vomiting   Chest Pain Associated symptoms: abdominal pain and back pain   Associated symptoms: no cough, no fever, no nausea, no numbness, no shortness of breath, no vomiting and no weakness   Testicle Pain Associated symptoms include chest pain and abdominal pain. Pertinent negatives include no shortness of breath.       Home Medications Prior to Admission medications   Medication Sig Start Date End Date Taking? Authorizing Provider  bisacodyl (DULCOLAX) 5 MG EC tablet Take 1 tablet (5 mg total) by mouth daily as needed for moderate constipation. 06/13/15   Montez Morita, PA-C  docusate sodium (COLACE) 100 MG capsule Take 1 capsule (100 mg total) by mouth 2  (two) times daily. 06/13/15   Montez Morita, PA-C  enoxaparin (LOVENOX) 40 MG/0.4ML injection Inject 0.4 mLs (40 mg total) into the skin daily. 06/13/15   Montez Morita, PA-C  methocarbamol (ROBAXIN) 500 MG tablet Take 1-2 tablets (500-1,000 mg total) by mouth every 6 (six) hours as needed for muscle spasms. 06/13/15   Montez Morita, PA-C  naproxen sodium (ANAPROX) 220 MG tablet Take 220 mg by mouth 2 (two) times daily with a meal.    [provider]  oxyCODONE (OXY IR/ROXICODONE) 5 MG immediate release tablet Take 1-2 tablets (5-10 mg total) by mouth every 6 (six) hours as needed for breakthrough pain (take between percocet as needed for breakthrough pain). 06/13/15   Montez Morita, PA-C  oxyCODONE-acetaminophen (PERCOCET/ROXICET) 5-325 MG per tablet Take 1-2 tablets by mouth every 6 (six) hours as needed for moderate pain or severe pain. 06/13/15   Montez Morita, PA-C  polyethylene glycol [facility]) packet Take 17 g by mouth daily. 06/13/15   Montez Morita, PA-C      Allergies    Patient has no known allergies.    Review of Systems   Review of Systems  Constitutional:  Positive for chills. Negative for fever.  HENT:  Negative for sore throat.   Respiratory:  Negative for cough and shortness of breath.   Cardiovascular:  Positive for chest pain.  Gastrointestinal:  Positive for abdominal pain. Negative for diarrhea, nausea and vomiting.  Genitourinary:  Positive for flank pain and testicular pain. Negative for dysuria and hematuria.  Musculoskeletal:  Positive for back pain.  Skin:  Negative for rash.  Neurological:  Negative for weakness and numbness.    Physical Exam Updated Vital Signs BP (!) 168/113 (BP Location: Right Arm)   Pulse 65   Temp 97.9 F (36.6 C)   Resp 18   Ht 6\' 5"  (1.956 m)   Wt 131.5 kg   SpO2 100%   BMI 34.39 kg/m  Physical Exam Vitals and nursing note reviewed.  Constitutional:      General: He is not in acute distress.    Appearance: Normal appearance. He  is well-developed.  HENT:     Head: Normocephalic and atraumatic.  Eyes:     Conjunctiva/sclera: Conjunctivae normal.  Cardiovascular:     Rate and Rhythm: Normal rate and regular rhythm.     Heart sounds: No murmur heard. Pulmonary:     Effort: Pulmonary effort is normal. No respiratory distress.     Breath sounds: Normal breath sounds.  Abdominal:     Palpations: Abdomen is soft.     Tenderness: There is no abdominal tenderness. There is no guarding or rebound.  Genitourinary:    Testes: Normal.        Right: Mass not present.        Left: Mass not present.  Musculoskeletal:        General: Normal range of motion.     Cervical back: Neck supple.     Right lower leg: No edema.     Left lower leg: No edema.  Skin:    General: Skin is warm and dry.     Capillary Refill: Capillary refill takes less than 2 seconds.  Neurological:     General: No focal deficit present.     Mental Status: He is alert.     Sensory: No sensory deficit.     Motor: No weakness.     Gait: Gait normal.     ED Results / Procedures / Treatments   Labs (all labs ordered are listed, but only abnormal results are displayed) Labs Reviewed  BASIC METABOLIC PANEL - Abnormal; Notable for the following components:      Result Value   Glucose, Bld 219 (*)    Creatinine, Ser 1.31 (*)    All other components within normal limits  URINALYSIS, ROUTINE W REFLEX MICROSCOPIC - Abnormal; Notable for the following components:   Glucose, UA 100 (*)    Hgb urine dipstick SMALL (*)    Protein, ur 30 (*)    All other components within normal limits  CBC WITH DIFFERENTIAL/PLATELET  TROPONIN I (HIGH SENSITIVITY)  TROPONIN I (HIGH SENSITIVITY)    EKG EKG Interpretation  Date/Time:  Friday June 12 2022 08:34:22 EDT Ventricular Rate:  62 PR Interval:  196 QRS Duration: 86 QT Interval:  402 QTC Calculation: 408 R Axis:   -24 Text Interpretation: Normal sinus rhythm Nonspecific T wave abnormality Abnormal  ECG When compared with ECG of 23-Oct-2014 22:08, No significant change was found Confirmed by Aletta Edouard [phone]) on 06/12/2022 8:35:26 AM  Radiology CT Renal Stone Study  Result Date: 06/12/2022 CLINICAL DATA:  Acute left lower quadrant abdominal pain. EXAM: CT ABDOMEN AND PELVIS WITHOUT CONTRAST TECHNIQUE: Multidetector CT imaging of the abdomen and pelvis was performed following the standard protocol without IV contrast. RADIATION DOSE REDUCTION: This exam was performed according to the departmental dose-optimization program which includes automated exposure control, adjustment  of the mA and/or kV according to patient size and/or use of iterative reconstruction technique. COMPARISON:  June 15, 2015. FINDINGS: Lower chest: No acute abnormality. Hepatobiliary: Hepatic steatosis. No gallstones or biliary dilatation is noted. Pancreas: Unremarkable. No pancreatic ductal dilatation or surrounding inflammatory changes. Spleen: Normal in size without focal abnormality. Adrenals/Urinary Tract: Adrenal glands appear normal. Minimal left hydroureteronephrosis is noted potentially due to 1-2 mm calculus in the distal left ureter best seen on image number 43 of series 5. Urinary bladder is unremarkable. Stomach/Bowel: Stomach is within normal limits. Appendix appears normal. No definite evidence of bowel obstruction or inflammation is noted. Vascular/Lymphatic: No significant vascular findings are present. No enlarged abdominal or pelvic lymph nodes. Reproductive: Prostate is unremarkable. Other: No abdominal wall hernia or abnormality. No abdominopelvic ascites. Musculoskeletal: No acute or significant osseous findings. IMPRESSION: Minimal left hydroureteronephrosis is noted potentially due to 1-2 mm calculus in the distal left ureter. Hepatic steatosis. Electronically Signed   By: Lupita Raider M.D.   On: 06/12/2022 09:28   DG Chest Portable 1 View  Result Date: 06/12/2022 CLINICAL DATA:  Chest pain, lower  abdominal/left groin pain EXAM: PORTABLE CHEST 1 VIEW COMPARISON:  Overlapping portion of CT abdomen 06/12/2022. FINDINGS: Heart size within normal limits for AP projection. The lungs appear clear. No blunting of the costophrenic angles. No substantial bony abnormality observed. IMPRESSION: 1.  No significant abnormality identified. Electronically Signed   By: Gaylyn Rong M.D.   On: 06/12/2022 09:24    Procedures Procedures    Medications Ordered in ED Medications  ketorolac (TORADOL) 30 MG/ML injection 30 mg (has no administration in time range)  sodium chloride 0.9 % bolus 500 mL (has no administration in time range)  morphine (PF) 4 MG/ML injection 4 mg (has no administration in time range)    ED Course/ Medical Decision Making/ A&P Clinical Course as of 06/12/22 1722  Fri Jun 12, 2022  0912 Chest x-ray does not show any clear infiltrate no pneumothorax.  Awaiting radiology reading. [MB]  1229 Patient states his pain is resolved right now.  His work-up is negative for cardiac but does show likely kidney stone.  Will discharge with prescriptions for pain medicine and Flomax.  Return instructions discussed. [MB]    Clinical Course User Index [MB] Terrilee Files, MD                           Medical Decision Making Amount and/or Complexity of Data Reviewed Labs: ordered. Radiology: ordered.  Risk Prescription drug management.  This patient complains of left-sided abdominal pain chest pain testicular pain; this involves an extensive number of treatment Options and is a complaint that carries with it a high risk of complications and morbidity. The differential includes renal colic, pyelonephritis, musculoskeletal, hernia, diverticulitis, ACS  I ordered, reviewed and interpreted labs, which included CBC normal chemistries with elevated glucose elevated creatinine, urinalysis with 6-10 red blood cells, troponins flat I ordered medication IV fluids Toradol pain medication and  reviewed PMP when indicated. I ordered imaging studies which included chest x-ray and CT renal and I independently    visualized and interpreted imaging which showed 2 mm ureteral stone Additional history obtained from patient's wife Previous records obtained and reviewed in epic no recent admissions  Cardiac monitoring reviewed, normal sinus rhythm Social determinants considered, no significant barriers Critical Interventions: None  After the interventions stated above, I reevaluated the patient and found patient's pain to be  adequately controlled Admission and further testing considered, no indications for admission or further work-up at this time.  Patient will be given pain medication and Flomax, urology follow-up information, return instructions discussed          Final Clinical Impression(s) / ED Diagnoses Final diagnoses:  Nonspecific chest pain  Renal colic on left side    Rx / DC Orders ED Discharge Orders          Ordered    oxyCODONE-acetaminophen (PERCOCET/ROXICET) 5-325 MG tablet  Every 6 hours PRN        06/12/22 1231    tamsulosin (FLOMAX) 0.4 MG CAPS capsule  Daily        06/12/22 1231              Hayden Rasmussen, MD 06/12/22 1746

## 2022-06-12 NOTE — Discharge Instructions (Addendum)
You are seen in the emergency department for some left-sided flank and abdominal pain.  You have a 1 to 2 mm kidney stone on the tube going from the kidney to the bladder.  This should pass on its own.  Drink plenty of fluids.  Ibuprofen.  We are prescribing some narcotic pain medication.  Return to the emergency department if any high fevers or other concerning symptoms.

## 2022-06-12 NOTE — ED Notes (Signed)
Pt to CT

## 2022-06-12 NOTE — ED Triage Notes (Signed)
Pt presents POV from home   Pt reports waking at 0530 with Left lower abd pain, radiating up into his Left chest and then into his Left testicle, in that order. Pt denies any urinary symptoms, no penile d/c, no kidney stones, N/V/D or SOB

## 2022-07-06 ENCOUNTER — Other Ambulatory Visit: Payer: Self-pay

## 2022-07-06 ENCOUNTER — Emergency Department (HOSPITAL_BASED_OUTPATIENT_CLINIC_OR_DEPARTMENT_OTHER)
Admission: EM | Admit: 2022-07-06 | Discharge: 2022-07-06 | Disposition: A | Discharge status: Home / Self Care | Payer: No Typology Code available for payment source | Attending: Emergency Medicine | Admitting: Emergency Medicine

## 2022-07-06 DIAGNOSIS — Z23 Encounter for immunization: Secondary | ICD-10-CM | POA: Diagnosis not present

## 2022-07-06 DIAGNOSIS — Z205 Contact with and (suspected) exposure to viral hepatitis: Secondary | ICD-10-CM | POA: Insufficient documentation

## 2022-07-06 DIAGNOSIS — Z7721 Contact with and (suspected) exposure to potentially hazardous body fluids: Secondary | ICD-10-CM | POA: Diagnosis present

## 2022-07-06 DIAGNOSIS — Z20828 Contact with and (suspected) exposure to other viral communicable diseases: Secondary | ICD-10-CM

## 2022-07-06 LAB — CBC WITH DIFFERENTIAL/PLATELET
Abs Immature Granulocytes: 0.02 10*3/uL (ref 0.00–0.07)
Basophils Absolute: 0 10*3/uL (ref 0.0–0.1)
Basophils Relative: 0 %
Eosinophils Absolute: 0 10*3/uL (ref 0.0–0.5)
Eosinophils Relative: 1 %
HCT: 46.7 % (ref 39.0–52.0)
Hemoglobin: 15.3 g/dL (ref 13.0–17.0)
Immature Granulocytes: 0 %
Lymphocytes Relative: 31 %
Lymphs Abs: 1.7 10*3/uL (ref 0.7–4.0)
MCH: 27.6 pg (ref 26.0–34.0)
MCHC: 32.8 g/dL (ref 30.0–36.0)
MCV: 84.1 fL (ref 80.0–100.0)
Monocytes Absolute: 0.5 10*3/uL (ref 0.1–1.0)
Monocytes Relative: 9 %
Neutro Abs: 3.3 10*3/uL (ref 1.7–7.7)
Neutrophils Relative %: 59 %
Platelets: 240 10*3/uL (ref 150–400)
RBC: 5.55 MIL/uL (ref 4.22–5.81)
RDW: 12.2 % (ref 11.5–15.5)
WBC: 5.5 10*3/uL (ref 4.0–10.5)
nRBC: 0 % (ref 0.0–0.2)

## 2022-07-06 LAB — RAPID HIV SCREEN (HIV 1/2 AB+AG)
HIV 1/2 Antibodies: NONREACTIVE
HIV-1 P24 Antigen - HIV24: NONREACTIVE

## 2022-07-06 LAB — HEPATITIS PANEL, ACUTE
HCV Ab: NONREACTIVE
Hep A IgM: NONREACTIVE
Hep B C IgM: NONREACTIVE
Hepatitis B Surface Ag: NONREACTIVE

## 2022-07-06 MED ORDER — HEPATITIS B IMMUNE GLOBULIN IM SOLN
0.0600 mL/kg | Freq: Once | INTRAMUSCULAR | Status: AC
  Administered 2022-07-06: 7.9 mL via INTRAMUSCULAR
  Filled 2022-07-06: qty 7.9

## 2022-07-06 MED ORDER — BICTEGRAVIR-EMTRICITAB-TENOFOV 50-200-25 MG PO TABS: 1.0000 | ORAL_TABLET | Freq: Every day | ORAL | 0 refills | Status: AC
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
## 2022-07-06 NOTE — ED Provider Notes (Signed)
We do not have hepatitis B immune globulin eyedropper agent readily available, in order to decrease patient wait time he will go personal vehicle to [facility] emergency department and receive the immunoglobulin there.  Spoke with Dr. Sharlett Iles who accepts transfer of this patient.  The triage nurse at [facility] made aware.   Anselmo Pickler, PA-C 07/06/22 1701    Cristie Hem, MD 07/07/22 1208

## 2022-07-06 NOTE — ED Notes (Signed)
Pt is heading to [facility] ED for Hepatitis B immune globin injection. [facility] triage RN notified, pharmacy notified, and [place] PA notified.

## 2023-03-23 ENCOUNTER — Encounter: Payer: Self-pay | Admitting: Cardiology

## 2023-03-23 ENCOUNTER — Ambulatory Visit: Payer: BC Managed Care – PPO | Admitting: Cardiology

## 2023-03-23 VITALS — BP 134/87 | HR 69 | Resp 16 | Ht 77.0 in | Wt 283.8 lb

## 2023-03-23 DIAGNOSIS — R002 Palpitations: Secondary | ICD-10-CM

## 2023-03-23 DIAGNOSIS — R0602 Shortness of breath: Secondary | ICD-10-CM

## 2023-03-23 DIAGNOSIS — R072 Precordial pain: Secondary | ICD-10-CM

## 2023-03-23 DIAGNOSIS — I10 Essential (primary) hypertension: Secondary | ICD-10-CM

## 2023-03-23 DIAGNOSIS — E781 Pure hyperglyceridemia: Secondary | ICD-10-CM

## 2023-03-23 DIAGNOSIS — E782 Mixed hyperlipidemia: Secondary | ICD-10-CM
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
## 2023-04-05 ENCOUNTER — Ambulatory Visit: Payer: BC Managed Care – PPO

## 2023-04-05 DIAGNOSIS — R0602 Shortness of breath: Secondary | ICD-10-CM

## 2023-04-05 DIAGNOSIS — R072 Precordial pain: Secondary | ICD-10-CM

## 2023-04-05 DIAGNOSIS — R002 Palpitations: Secondary | ICD-10-CM

## 2023-04-14 ENCOUNTER — Ambulatory Visit: Payer: BC Managed Care – PPO

## 2023-04-14 DIAGNOSIS — R072 Precordial pain: Secondary | ICD-10-CM

## 2023-04-14 DIAGNOSIS — R0602 Shortness of breath: Secondary | ICD-10-CM

## 2023-04-14 NOTE — Progress Notes (Signed)
Pt aware.

## 2023-05-11 ENCOUNTER — Ambulatory Visit: Payer: BC Managed Care – PPO | Admitting: Cardiology

## 2023-06-03 ENCOUNTER — Ambulatory Visit: Payer: BC Managed Care – PPO | Admitting: Cardiology

## 2023-06-03 ENCOUNTER — Encounter: Payer: Self-pay | Admitting: Cardiology

## 2023-06-03 VITALS — BP 134/93 | HR 87 | Resp 16 | Ht 77.0 in | Wt 289.0 lb

## 2023-06-03 DIAGNOSIS — R072 Precordial pain: Secondary | ICD-10-CM

## 2023-06-03 DIAGNOSIS — R0602 Shortness of breath: Secondary | ICD-10-CM

## 2023-06-03 DIAGNOSIS — E781 Pure hyperglyceridemia: Secondary | ICD-10-CM

## 2023-06-03 DIAGNOSIS — I10 Essential (primary) hypertension: Secondary | ICD-10-CM

## 2023-06-03 DIAGNOSIS — R002 Palpitations: Secondary | ICD-10-CM

## 2023-06-03 DIAGNOSIS — E782 Mixed hyperlipidemia: Secondary | ICD-10-CM

## 2023-06-03 NOTE — Progress Notes (Signed)
ID:  Hunter [name], DOB [birth year], MRN [id number]  PCP:  Jarrett Soho, PA-C  Cardiologist:  Tessa Lerner, DO, [facility] (established care 03/23/2023)  Date: 06/03/23 Last Office Visit: 03/23/2023  Chief Complaint  Patient presents with   Follow-up    Reevaluation of chest pain and shortness of breath    HPI  Hunter [name] is a 47 y.o. African-American male who presents to the clinic for evaluation of palpitations at the request of Jarrett Soho, [place]. His past medical history and cardiovascular risk factors include: Benign essential hypertension, non-insulin-dependent diabetes, hypertriglyceridemia.  Referred to the practice for evaluation of shortness of breath and palpitations.  His shortness of breath was noticeable at rest and also with exertion and at times had precordial discomfort as if he is having heartburn.  But no heart failure symptoms.  At the last office visit the shared decision was to proceed with echocardiogram, coronary calcium score, and stress test.  With regards to his palpitations he did undergo Zio patch.  Results reviewed with him in detail and noted below for further reference.    Since last office visit both shortness of breath and palpitations have resolved.  Home blood pressures are around 130/80 mmHg.  Coronary calcium score is still pending.  FUNCTIONAL STATUS: No structured exercise program or daily routine.   CARDIAC DATABASE: EKG: March 23, 2023: Sinus bradycardia, 59 bpm, nonspecific T wave abnormality, without underlying injury pattern.  No prior EKG available for comparison.  Echocardiogram: 04/14/2023: Left ventricle cavity is normal in size. Mild concentric hypertrophy of the left ventricle. Normal global wall motion. Normal LV systolic function with EF 66%. Normal diastolic filling pattern. No significant valvular abnormality.  The IVC is not well visualized.  Stress Testing: Exercise treadmill stress test 04/06/2023: Exercise treadmill  stress test performed using Bruce protocol. Patient exercised for a total of 9 minutes and 55 seconds, achieving 11.6 METS, and 101% of age predicted maximum heart rate. Exercise capacity was excellent. No chest pain reported. Normal heart rate and hemodynamic response. Stress EKG revealed no ischemic changes. Low risk study.  Cardiac monitor (Zio Patch): April 05, 2023 -April 14, 2023 Dominant rhythm sinus, followed by tachycardia (9% burden). Heart rate 30-166 bpm. Avg HR 82 bpm. Rare episodes of sinus rhythm with second-degree type I AV block (on 04/09/2023 at 7:12 AM, 04/06/2023 at 4 AM) No atrial fibrillation, supraventricular tachycardia, ventricular tachycardia, high grade AV block, pauses (3 seconds or longer). Total ventricular ectopic burden <1%. Total supraventricular ectopic burden <1%. Patient triggered events: 0.     ALLERGIES: No Known Allergies  MEDICATION LIST PRIOR TO VISIT: Current Meds  Medication Sig   amLODipine (NORVASC) 10 MG tablet Take 10 mg by mouth daily.   atorvastatin (LIPITOR) 20 MG tablet Take 20 mg by mouth daily.   losartan (COZAAR) 50 MG tablet Take 50 mg by mouth daily.   metFORMIN (GLUCOPHAGE-XR) 500 MG 24 hr tablet Take 1,000 mg by mouth 2 (two) times daily.     PAST MEDICAL HISTORY: Past Medical History:  Diagnosis Date   Diabetes mellitus without complication (HCC)    Femur fracture, right (HCC) 06/10/2015   Fracture, tibial plateau 06/17/2015   Hyperlipidemia    Hypertension     PAST SURGICAL HISTORY: Past Surgical History:  Procedure Laterality Date   FEMUR IM NAIL Right 06/11/2015   Procedure: INTRAMEDULLARY (IM) NAIL FEMORAL;  Surgeon: Myrene Galas, MD;  Location: [facility] OR;  Service: Orthopedics;  Laterality: Right;   ORIF TIBIA  PLATEAU Right 06/15/2015   Procedure: DE-ROTATE HIP AND REMOVE SCREWS REPLACE SCREWS, OPEN REDUCTION INTERNAL FIXATION (ORIF) TIBIAL PLATEAU;  Surgeon: Myrene Galas, MD;  Location: [facility] OR;  Service: Orthopedics;   Laterality: Right;    FAMILY HISTORY: The patient family history includes Breast cancer in his mother; Cancer - Prostate in his father; Hypertension in his brother.  SOCIAL HISTORY:  The patient  reports that he has never smoked. He has never used smokeless tobacco. He reports current alcohol use. He reports that he does not use drugs.  REVIEW OF SYSTEMS: Review of Systems  Cardiovascular:  Negative for chest pain, claudication, dyspnea on exertion, irregular heartbeat, leg swelling, near-syncope, orthopnea, palpitations, paroxysmal nocturnal dyspnea and syncope.  Respiratory:  Negative for shortness of breath.   Hematologic/Lymphatic: Negative for bleeding problem.  Musculoskeletal:  Negative for muscle cramps and myalgias.  Neurological:  Negative for dizziness and light-headedness.    PHYSICAL EXAM:    06/03/2023    2:16 PM 03/23/2023   10:38 AM 07/06/2022    9:37 PM  Vitals with BMI  Height 6\' 5"  6\' 5"    Weight 289 lbs 283 lbs 13 oz   BMI 34.26 33.65   Systolic 134 134 161  Diastolic 93 87 89  Pulse 87 69 72    Physical Exam  Constitutional: No distress.  Age appropriate, hemodynamically stable.   Neck: No JVD present.  Cardiovascular: Normal rate, regular rhythm, S1 normal, S2 normal, intact distal pulses and normal pulses. Exam reveals no gallop, no S3 and no S4.  No murmur heard. Pulmonary/Chest: Effort normal and breath sounds normal. No stridor. He has no wheezes. He has no rales.  Abdominal: Soft. Bowel sounds are normal. He exhibits no distension. There is no abdominal tenderness.  Musculoskeletal:        General: No edema.     Cervical back: Neck supple.  Neurological: He is alert and oriented to person, place, and time. He has intact cranial nerves (2-12).  Skin: Skin is warm and moist.    LABORATORY DATA: External Labs: Collected: August 31, 2022 provided by PCP. Hemoglobin A1c 9.1. Sodium 137, potassium 4.3, chloride 101, bicarb 29. BUN 8, creatinine  1.03 Total cholesterol 168, triglycerides 386, HDL 37, calculated LDL 70, non-HDL 133  Collected: Mar 02, 2023 provided by PCP. BUN 12, creatinine 1.17. Total cholesterol 153, triglycerides 213, HDL 38, calculated LDL 80, non-HDL 115. TSH 0.92. Hemoglobin 15, hematocrit 44.8%     Latest Ref Rng & Units 07/06/2022    7:47 PM 06/12/2022    8:53 AM 06/13/2015    5:22 AM  CBC  WBC 4.0 - 10.5 K/uL 5.5  5.2  6.8   Hemoglobin 13.0 - 17.0 g/dL 09.6  04.5  40.9   Hematocrit 39.0 - 52.0 % 46.7  44.5  38.3   Platelets 150 - 400 K/uL 240  181  132        Latest Ref Rng & Units 06/12/2022    8:53 AM 06/14/2015    4:51 AM 06/13/2015    5:22 AM  CMP  Glucose 70 - 99 mg/dL 811  914  782   BUN 6 - 20 mg/dL 14  10  12    Creatinine 0.61 - 1.24 mg/dL 9.56  2.13  0.86   Sodium 135 - 145 mmol/L 141  134  137   Potassium 3.5 - 5.1 mmol/L 3.8  3.3  3.6   Chloride 98 - 111 mmol/L 103  98  102  CO2 22 - 32 mmol/L 28  28  26    Calcium 8.9 - 10.3 mg/dL 9.2  8.3  8.3     No results found for: "CHOL", "HDL", "LDLCALC", "LDLDIRECT", "TRIG", "CHOLHDL" No components found for: "NTPROBNP" No results for input(s): "PROBNP" in the last 8760 hours. No results for input(s): "TSH" in the last 8760 hours.  BMP Recent Labs    06/12/22 0853  NA 141  K 3.8  CL 103  CO2 28  GLUCOSE 219*  BUN 14  CREATININE 1.31*  CALCIUM 9.2  GFRNONAA >60    HEMOGLOBIN A1C No results found for: "HGBA1C", "MPG"  IMPRESSION:    ICD-10-CM   1. Palpitations  R00.2     2. Shortness of breath  R06.02     3. Precordial pain  R07.2     4. Benign hypertension  I10     5. Mixed hyperlipidemia  E78.2     6. Pure hypertriglyceridemia  E78.1         RECOMMENDATIONS: DARIN CAPPO is a 47 y.o. African-American male whose past medical history and cardiac risk factors include: Benign essential hypertension, non-insulin-dependent diabetes, hypertriglyceridemia.  Palpitations No identifiable reversible cause. Zio patch  noted underlying rhythm to be sinus without any significant dysrhythmias. He has had intermittent episodes of second-degree type I AV block predominate nocturnally. Discussed being evaluated for sleep apnea -however, patient states that he thinks he does not have it.   Shortness of breath Precordial pain Resolved since last office visit. Echocardiogram notes preserved LVEF, see report for additional details. GXT: Low risk study Coronary calcium score still pending patient would like to hold off on it .  Benign hypertension Office blood pressure not at goal. Home blood pressure better controlled per patient. Continue current medical therapy. Currently managed by primary care provider.  Mixed hyperlipidemia Pure hypertriglyceridemia Currently on atorvastatin.   He denies myalgia or other side effects. Most recent lipids dated May 2024, dependently reviewed as noted above. Given the fact that he is a diabetic would recommend a goal LDL <70 mg/dL. Currently managed by primary care provider.  From a cardiovascular standpoint no additional testing warranted at this time.  He would like to hold off on the coronary calcium scoring as he is currently asymptomatic and the workup so far has been unremarkable.  I would like to see him back on as-needed basis.  I will defer him back to PCP for management of other chronic comorbid conditions.  FINAL MEDICATION LIST END OF ENCOUNTER: No orders of the defined types were placed in this encounter.   There are no discontinued medications.    Current Outpatient Medications:    amLODipine (NORVASC) 10 MG tablet, Take 10 mg by mouth daily., Disp: , Rfl:    atorvastatin (LIPITOR) 20 MG tablet, Take 20 mg by mouth daily., Disp: , Rfl:    losartan (COZAAR) 50 MG tablet, Take 50 mg by mouth daily., Disp: , Rfl:    metFORMIN (GLUCOPHAGE-XR) 500 MG 24 hr tablet, Take 1,000 mg by mouth 2 (two) times daily., Disp: , Rfl:   No orders of the defined types  were placed in this encounter.   There are no Patient Instructions on file for this visit.   --Continue cardiac medications as reconciled in final medication list. --Return if symptoms worsen or fail to improve. or sooner if needed. --Continue follow-up with your primary care physician regarding the management of your other chronic comorbid conditions.  Patient's questions and concerns were  addressed to his satisfaction. He voices understanding of the instructions provided during this encounter.   This note was created using a voice recognition software as a result there may be grammatical errors inadvertently enclosed that do not reflect the nature of this encounter. Every attempt is made to correct such errors.  Tessa Lerner, [place], [facility]  Pager:  [phone] Office: [phone]
